# Patient Record
Sex: Male | Born: 1960 | Race: Black or African American | Hispanic: No | Smoking: Never smoker
Health system: Southern US, Community
[De-identification: ages and names within clinical notes are randomized; demographics above are authoritative.]

## PROBLEM LIST (undated history)

## (undated) DIAGNOSIS — B181 Chronic viral hepatitis B without delta-agent: Secondary | ICD-10-CM

## (undated) DIAGNOSIS — I10 Essential (primary) hypertension: Secondary | ICD-10-CM

## (undated) DIAGNOSIS — N189 Chronic kidney disease, unspecified: Secondary | ICD-10-CM

## (undated) DIAGNOSIS — L818 Other specified disorders of pigmentation: Secondary | ICD-10-CM

## (undated) HISTORY — DX: Essential (primary) hypertension: I10

## (undated) HISTORY — DX: Other specified disorders of pigmentation: L81.8

## (undated) HISTORY — DX: Chronic viral hepatitis B without delta-agent: B18.1

## (undated) HISTORY — DX: Chronic kidney disease, unspecified: N18.9

---

## 1999-08-19 ENCOUNTER — Emergency Department (HOSPITAL_COMMUNITY): Admission: EM | Admit: 1999-08-19 | Discharge: 1999-08-19 | Payer: Self-pay | Admitting: Emergency Medicine

## 2002-02-24 ENCOUNTER — Encounter: Payer: Self-pay | Admitting: Gastroenterology

## 2002-02-24 ENCOUNTER — Encounter (INDEPENDENT_AMBULATORY_CARE_PROVIDER_SITE_OTHER): Payer: Self-pay | Admitting: Specialist

## 2002-02-24 ENCOUNTER — Ambulatory Visit (HOSPITAL_COMMUNITY): Admission: RE | Admit: 2002-02-24 | Discharge: 2002-02-24 | Payer: Self-pay | Admitting: Gastroenterology

## 2003-01-16 ENCOUNTER — Encounter: Payer: Self-pay | Admitting: Family Medicine

## 2003-01-16 ENCOUNTER — Ambulatory Visit (HOSPITAL_COMMUNITY): Admission: RE | Admit: 2003-01-16 | Discharge: 2003-01-16 | Payer: Self-pay | Admitting: Family Medicine

## 2008-11-11 ENCOUNTER — Encounter: Payer: Self-pay | Admitting: Gastroenterology

## 2009-07-07 ENCOUNTER — Encounter: Payer: Self-pay | Admitting: Gastroenterology

## 2009-11-18 ENCOUNTER — Ambulatory Visit: Payer: Self-pay | Admitting: Gastroenterology

## 2009-12-15 ENCOUNTER — Ambulatory Visit (HOSPITAL_COMMUNITY): Admission: RE | Admit: 2009-12-15 | Discharge: 2009-12-15 | Payer: Self-pay | Admitting: Gastroenterology

## 2010-03-31 ENCOUNTER — Ambulatory Visit: Payer: Self-pay | Admitting: Gastroenterology

## 2010-04-06 ENCOUNTER — Encounter (INDEPENDENT_AMBULATORY_CARE_PROVIDER_SITE_OTHER): Payer: Self-pay | Admitting: *Deleted

## 2010-05-06 ENCOUNTER — Encounter (INDEPENDENT_AMBULATORY_CARE_PROVIDER_SITE_OTHER): Payer: Self-pay | Admitting: *Deleted

## 2010-05-11 ENCOUNTER — Ambulatory Visit: Payer: Self-pay | Admitting: Gastroenterology

## 2010-05-25 ENCOUNTER — Ambulatory Visit: Payer: Self-pay | Admitting: Gastroenterology

## 2010-05-25 HISTORY — PX: COLONOSCOPY: SHX174

## 2010-06-23 ENCOUNTER — Ambulatory Visit: Payer: Self-pay | Admitting: Gastroenterology

## 2010-09-04 ENCOUNTER — Encounter: Payer: Self-pay | Admitting: Gastroenterology

## 2010-09-13 NOTE — Letter (Signed)
Summary: Previsit letter  [facility]  [address] [place], [place] [address]   Phone: [phone]  Fax: [phone]       04/06/2010 MRN: [id number]  [facility] [address] [place], [place]  [address]  [place]  Dear Mr. BAIG,  Welcome to the Gastroenterology Division at [facility].    You are scheduled to see a nurse for your pre-procedure visit on 05-11-10 at 11:00a.m. on the 3rd floor at [facility], [address]  We ask that you try to arrive at our office 15 minutes prior to your appointment time to allow for check-in.  Your nurse visit will consist of discussing your medical and surgical history, your immediate family medical history, and your medications.    Please bring a complete list of all your medications or, if you prefer, bring the medication bottles and we will list them.  We will need to be aware of both prescribed and over the counter drugs.  We will need to know exact dosage information as well.  If you are on blood thinners (Coumadin, Plavix, Aggrenox, Ticlid, etc.) please call our office today/prior to your appointment, as we need to consult with your physician about holding your medication.   Please be prepared to read and sign documents such as consent forms, a financial agreement, and acknowledgement forms.  If necessary, and with your consent, a friend or relative is welcome to sit-in on the nurse visit with you.  Please bring your insurance card so that we may make a copy of it.  If your insurance requires a referral to see a specialist, please bring your referral form from your primary care physician.  No co-pay is required for this nurse visit.     If you cannot keep your appointment, please call [phone] to cancel or reschedule prior to your appointment date.  This allows [place] the opportunity to schedule an appointment for another patient in need of care.    Thank you for choosing [facility] for your medical  needs.  We appreciate the opportunity to care for you.  Please visit [place] at our website  to learn more about our practice.                     Sincerely.                                                                                                                   The Gastroenterology Division

## 2010-09-13 NOTE — Letter (Signed)
Summary: Moviprep Instructions  [facility]  [address]   [place], [place] [address]   Phone: [phone]  Fax: [phone]       [name] [name]    Dec 15, 1960    MRN: [id number]        Procedure Day Dorna Bloom: Wednesday, 05-25-10     Arrival Time: 1:30 p.m.     Procedure Time: 2:30 p.m.     Location of Procedure:                    x   [facility] (4th Floor)                        PREPARATION FOR COLONOSCOPY WITH MOVIPREP   Starting 5 days prior to your procedure 05-20-10  do not eat nuts, seeds, popcorn, corn, beans, peas,  salads, or any raw vegetables.  Do not take any fiber supplements (e.g. Metamucil, Citrucel, and Benefiber).  THE DAY BEFORE YOUR PROCEDURE         DATE: 05-24-10  DAY: Tuesday   1.  Drink clear liquids the entire day-NO SOLID FOOD  2.  Do not drink anything colored red or purple.  Avoid juices with pulp.  No orange juice.  3.  Drink at least 64 oz. (8 glasses) of fluid/clear liquids during the day to prevent dehydration and help the prep work efficiently.  CLEAR LIQUIDS INCLUDE: Water Jello Ice Popsicles Tea (sugar ok, no milk/cream) Powdered fruit flavored drinks Coffee (sugar ok, no milk/cream) Gatorade Juice: apple, white grape, white cranberry  Lemonade Clear bullion, consomm, broth Carbonated beverages (any kind) Strained chicken noodle soup Hard Candy                             4.  In the morning, mix first dose of MoviPrep solution:    Empty 1 Pouch A and 1 Pouch B into the disposable container    Add lukewarm drinking water to the top line of the container. Mix to dissolve    Refrigerate (mixed solution should be used within 24 hrs)  5.  Begin drinking the prep at 5:00 p.m. The MoviPrep container is divided by 4 marks.   Every 15 minutes drink the solution down to the next mark (approximately 8 oz) until the full liter is complete.   6.  Follow completed prep with 16 oz of clear liquid of your  choice (Nothing red or purple).  Continue to drink clear liquids until bedtime.  7.  Before going to bed, mix second dose of MoviPrep solution:    Empty 1 Pouch A and 1 Pouch B into the disposable container    Add lukewarm drinking water to the top line of the container. Mix to dissolve    Refrigerate  THE DAY OF YOUR PROCEDURE      DATE: 05-25-10  DAY: Wednesday  Beginning at 9:30 a.m.. (5 hours before procedure):         1. Every 15 minutes, drink the solution down to the next mark (approx 8 oz) until the full liter is complete.  2. Follow completed prep with 16 oz. of clear liquid of your choice.    3. You may drink clear liquids until 12:30 p.m. (2 HOURS BEFORE PROCEDURE).   MEDICATION INSTRUCTIONS  Unless otherwise instructed, you should take regular prescription medications with a small sip of water   as early as possible  the morning of your procedure.        OTHER INSTRUCTIONS  You will need a responsible adult at least 51 years of age to accompany you and drive you home.   This person must remain in the waiting room during your procedure.  Wear loose fitting clothing that is easily removed.  Leave jewelry and other valuables at home.  However, you may wish to bring a book to read or  an iPod/MP3 player to listen to music as you wait for your procedure to start.  Remove all body piercing jewelry and leave at home.  Total time from sign-in until discharge is approximately 2-3 hours.  You should go home directly after your procedure and rest.  You can resume normal activities the  day after your procedure.  The day of your procedure you should not:   Drive   Make legal decisions   Operate machinery   Drink alcohol   Return to work  You will receive specific instructions about eating, activities and medications before you leave.    The above instructions have been reviewed and explained to me by   Karl Bales RN  May 11, 2010 11:23  AM    I fully understand and can verbalize these instructions _____________________________ Date _________

## 2010-09-13 NOTE — Miscellaneous (Signed)
Summary: [facility] previsit  Clinical Lists Changes  Medications: Added new medication of MOVIPREP 100 GM  SOLR (PEG-KCL-NACL-NASULF-NA ASC-C) As per prep instructions. - Signed Rx of MOVIPREP 100 GM  SOLR (PEG-KCL-NACL-NASULF-NA ASC-C) As per prep instructions.;  #1 x 0;  Signed;  Entered by: Karl Bales RN;  Authorized by: Louis Meckel MD;  Method used: [facility] to [facility] [phone]*, [address], [place], [place], [place]  [address], Ph: [phone] or [phone], Fax: [phone] Observations: Added new observation of NKA: T (05/11/2010 10:46)    Prescriptions: MOVIPREP 100 GM  SOLR (PEG-KCL-NACL-NASULF-NA ASC-C) As per prep instructions.  #1 x 0   Entered by:   Karl Bales RN   Authorized by:   Louis Meckel MD   Signed by:   Karl Bales RN on 05/11/2010   Method used:   Electronically to        [facility] [phone]* (retail)       [address]       [place], [place]  [address]       Ph: [phone] or [phone]       Fax: [phone]   RxID:   [id number]

## 2010-09-13 NOTE — Letter (Signed)
Summary: [facility]   Imported By: Sherian Rein 10/29/2009 15:16:16  _____________________________________________________________________  External Attachment:    Type:   Image     Comment:   External Document

## 2010-09-13 NOTE — Procedures (Signed)
Summary: Colonoscopy  Patient: [name] [name] Note: All result statuses are Final unless otherwise noted.  Tests: (1) Colonoscopy (COL)   COL Colonoscopy           DONE     [facility]     [address]     [place], [place]  [address]           COLONOSCOPY PROCEDURE REPORT           PATIENT:  [name], [name]  MR#:  [id number]     BIRTHDATE:  01-25-1961, 49 yrs. old  GENDER:  male           ENDOSCOPIST:  Barbette Hair. Arlyce Dice, MD     Referred by:           PROCEDURE DATE:  05/25/2010     PROCEDURE:  Diagnostic Colonoscopy     ASA CLASS:  Class II     INDICATIONS:  1) Routine Risk Screening           MEDICATIONS:   Fentanyl 75 mcg IV, Versed 7 mg IV           DESCRIPTION OF PROCEDURE:   After the risks benefits and     alternatives of the procedure were thoroughly explained, informed     consent was obtained.  Digital rectal exam was performed and     revealed no abnormalities.   The [id number] [id number] endoscope was     introduced through the anus and advanced to the cecum, which was     identified by both the appendix and ileocecal valve, without     limitations.  The quality of the prep was excellent, using     MiraLax.  The instrument was then slowly withdrawn as the colon     was fully examined.     <<PROCEDUREIMAGES>>           FINDINGS:  A normal appearing cecum, ileocecal valve, and     appendiceal orifice were identified. The ascending, hepatic     flexure, transverse, splenic flexure, descending, sigmoid colon,     and rectum appeared unremarkable (see image1, image3, image6,     image7, image8, image13, image14, and image16).   Retroflexed     views in the rectum revealed no abnormalities.    The time to     cecum =  2.75  minutes. The scope was then withdrawn (time =  9.0     min) from the patient and the procedure completed.           COMPLICATIONS:  None           ENDOSCOPIC IMPRESSION:     1) Normal colon     RECOMMENDATIONS:     1) Continue current  colorectal screening recommendations for     "routine risk" patients with a repeat colonoscopy in 10 years.           REPEAT EXAM:  In 10 year(s) for Colonoscopy.           ______________________________     Barbette Hair. Arlyce Dice, MD           CC: Rudi Heap, MD           n.     Rosalie Doctor:   Barbette Hair. Kaplan at 05/25/2010 03:12 PM           [name], [id number]  Note: An exclamation mark (!) indicates a result that was not dispersed into the flowsheet. Document Creation  Date: 05/25/2010 3:13 PM _______________________________________________________________________  (1) Order result status: Final Collection or observation date-time: 05/25/2010 15:01 Requested date-time:  Receipt date-time:  Reported date-time:  Referring Physician:   Ordering Physician: Melvia Heaps [phone]) Specimen Source:  Source: Launa Grill Order Number: [id number] Lab site:   Appended Document: Colonoscopy    Clinical Lists Changes  Observations: Added new observation of COLONNXTDUE: 05/2020 (05/25/2010 15:41)

## 2010-09-13 NOTE — Letter (Signed)
Summary: [facility]   Imported By: Sherian Rein 10/29/2009 15:17:56  _____________________________________________________________________  External Attachment:    Type:   Image     Comment:   External Document

## 2010-09-29 ENCOUNTER — Ambulatory Visit: Payer: Self-pay | Admitting: Gastroenterology

## 2010-12-29 ENCOUNTER — Ambulatory Visit (INDEPENDENT_AMBULATORY_CARE_PROVIDER_SITE_OTHER): Payer: Self-pay | Admitting: Gastroenterology

## 2010-12-29 VITALS — BP 147/92 | HR 59 | Temp 96.7°F | Ht 70.0 in | Wt 196.0 lb

## 2010-12-29 DIAGNOSIS — B181 Chronic viral hepatitis B without delta-agent: Secondary | ICD-10-CM

## 2010-12-29 LAB — PHOSPHORUS: Phosphorus: 3 mg/dL (ref 2.3–4.6)

## 2010-12-29 LAB — HEPATIC FUNCTION PANEL
Albumin: 4.5 g/dL (ref 3.5–5.2)
Alkaline Phosphatase: 65 U/L (ref 39–117)
Total Protein: 7.1 g/dL (ref 6.0–8.3)

## 2010-12-29 LAB — AFP TUMOR MARKER: AFP-Tumor Marker: 6.7 ng/mL (ref 0.0–8.0)

## 2010-12-29 MED ORDER — TENOFOVIR DISOPROXIL FUMARATE 300 MG PO TABS: 300.0000 mg | ORAL_TABLET | Freq: Every day | ORAL | Status: DC

## 2010-12-29 NOTE — Progress Notes (Signed)
cc: Referring Physician: Rudi Heap, MD, [facility], [address], [place], [place] [address], Fax # [phone]    [facility] MEDICAL RECORD [id number]   REASON FOR VISIT:  Follow up genotype G, hepatitis B E antigen positive HBV.    history:  The patient returns today unaccompanied. Since last being seen on 06/23/2010, he feels well. There are no symptoms to suggest decompensated liver disease nor are there symptoms directly referable to his history of HBV.   He ran out of tenofovir two weeks ago due to a change in insurance at his work.  Past medical history:  No interval change.   CURRENT MEDICATIONS:  Tenofovir 300 mg p.o. Daily (supposed to be on this but off this for two weeks), entecavir 0.5 mg p.o. daily, calcium with vitamin D 500 mg/200 international units p.o. b.i.d.   Allergies:  Denies.   habits:  Smoking never. Alcohol as never.   REVIEW OF SYSTEMS:  All 10 systems reviewed today with the patient and they are negative other than which was mentioned above. His CES-D was a 7.   PHYSICAL EXAMINATION:  Constitutional: Appeared well.  Vital signs: BP 147/92  Pulse 59  Temp 96.7 F (35.9 C)  Ht 5\' 10"  (1.778 m)  Wt 196 lb (88.905 kg)  BMI 28.12 kg/m2 Ears, nose, mouth and throat:  Unremarkable oropharynx.  No thyromegaly or neck masses.   Chest:  Resonant to percussion.  Clear to auscultation.   Cardiovascular:  Heart sounds normal S1, S2 without murmurs or rubs.  There is no peripheral edema.   Abdominal:  Normal  bowel sounds.  No masses or tenderness.  I could not appreciate a liver edge or spleen tip.  I could not appreciate any hernias.   Lymphatics:  No cervical or inguinal lymphadenopathy.   Central Nervous System:  No asterixis or focal neurologic findings.   Dermatologic:  Anicteric without palmar erythema or spider angiomata.   Eyes:  Anicteric sclerae.  Pupils are equal and reactive to light.   LABORATORY STUDIES:  From  06/23/2010 Creatinine 1.61.  HBV DNA 617 IU/mL.  Hepatitis B e antigen positive.  ALT  64.  His last ultrasound was on 12/15/2009.      Assessment:  The patient is a 50 year old gentleman with a history of genotype G, E antigen positive HBV with a previous biopsy November 2008 showing grade 1 stage 1 disease. He is on a combination of tenofovir since  2006, which was really started as emtricitabine/tenofovir but now on tenofovir monotherapy, and entecavir added September 2007 for lack of response.  So he is on a combination of tenofovir and entecavir for 4.5 years.  He remains well. Viral load remains suppressed and is not on the rise to suggest there was no resistance. To be sure it would be better if it was negative considering the degree of medication he is taking.  In my discussion today with the patient, we reviewed the results of testing.  I explained that he must not run out his meds.  PLAN:  1. Continue with current medications for the foreseeable future unless labs today dictate otherwise. 2. Will check his creatinine, phosphorus and his HBV DNA, E antigen and E antibody. 3. He is to return in 6 months' time. 4. He was hepatitis A immune previously. 5. Ultrasound of liver to be booked. 6. I have given a prescription for tenofovir 300 mg p.o. daily, 90-day supply with 4 refills.

## 2011-01-01 LAB — HEPATITIS B E ANTIBODY: Hepatitis Be Antibody: NONREACTIVE

## 2011-01-01 LAB — HEPATITIS B E ANTIGEN: Hepatitis Be Antigen: REACTIVE — AB

## 2011-01-04 LAB — HEPATITIS B DNA, ULTRAQUANTITATIVE, PCR
Hepatitis B DNA (Calc): 19031 copies/mL — ABNORMAL HIGH (ref <116)
Hepatitis B DNA: 3270 IU/mL — ABNORMAL HIGH (ref <20)

## 2011-01-06 ENCOUNTER — Other Ambulatory Visit (HOSPITAL_COMMUNITY): Payer: Self-pay

## 2011-07-13 ENCOUNTER — Ambulatory Visit: Payer: Self-pay | Admitting: Gastroenterology

## 2011-07-27 ENCOUNTER — Ambulatory Visit (INDEPENDENT_AMBULATORY_CARE_PROVIDER_SITE_OTHER): Payer: Self-pay | Admitting: Gastroenterology

## 2011-07-27 DIAGNOSIS — B181 Chronic viral hepatitis B without delta-agent: Secondary | ICD-10-CM

## 2011-07-27 LAB — PHOSPHORUS: Phosphorus: 2.9 mg/dL (ref 2.3–4.6)

## 2011-07-28 LAB — HEPATITIS B SURFACE ANTIBODY,QUALITATIVE: Hep B S Ab: NEGATIVE

## 2011-07-31 LAB — HEPATITIS B E ANTIBODY: Hepatitis Be Antibody: NEGATIVE

## 2011-07-31 LAB — HEPATITIS B E ANTIGEN: Hepatitis Be Antigen: POSITIVE — AB

## 2011-08-03 ENCOUNTER — Encounter: Payer: Self-pay | Admitting: Gastroenterology

## 2011-08-03 NOTE — Progress Notes (Addendum)
NAMEMarland Kitchen  ABHIRAM, CRIADO  MR#:  [id number]      DATE:  07/27/2011  DOB:  [birth year]    cc: Referring Physician: Rudi Heap, MD, [facility], [address], [place], [place] [address], Fax # [phone]     [facility] medical record number:  [id number]   REASON FOR VISIT:  Followup genotype G, E antigen positive hepatitis B.    HISTORY:  The patient returns unaccompanied. Since last being seen he has no complaints referable to his history of hepatitis B. There are no symptoms to suggest decompensated liver disease or vasculitis.   PAST MEDICAL HISTORY:  No interval change.   CURRENT MEDICATIONS:  1. Tenofovir 300 mg daily. 2. Entecavir 0.5 mg daily. 3. Calcium with vitamin D 500 mg/200 international units b.i.d.   Allergies:  Denies.     habits:   Smoking:  Never.    Alcohol:  Denies interval consumption.   REVIEW OF SYSTEMS:  All 10 systems reviewed today with the patient, and they are negative other than that which was mentioned above. CES-D was 3.   PHYSICAL EXAMINATION:  Constitutional: Well appearing. Vital signs: Height 69 inches, weight 205 pounds, blood pressure 155/80, 82, pulse 62, temperature 97.5 degrees Fahrenheit. Ears, nose, mouth and throat:  Unremarkable  oropharynx.  No thyromegaly or neck masses.  Chest:  Resonant to percussion.  Clear to auscultation.  Cardiovascular:  Heart sounds normal S1, S2 without murmurs or rubs.  There is no peripheral edema.   Abdominal:  Normal bowel sounds.  No masses or tenderness.  I could not appreciate a liver edge or spleen tip.  I could not appreciate any hernias.  Lymphatics:  No cervical or inguinal lymphadenopathy.   Central Nervous System:  No asterixis or focal neurologic findings.  Dermatologic:  Anicteric without palmar erythema or spider angiomata.  Eyes:  Anicteric sclerae.  Pupils are equal and reactive to light.   LABORATORY STUDIES:  From 12/29/2010 his ALT was 52. His hepatitis B virus  DNA was 3270, which has gone up from previous. His hepatitis B surface antigen has remained positive. His creatinine was 1.39.   ASSESSMENT:  The patient is a 50 year old gentleman with a history of genotype G, E antigen positive hepatitis B virus with a previous biopsy in November 2008 showing grade 1, stage I disease. He has been on some combination containing tenofovir since 2006 which was really started as emtricitabine and tenofovir but now it is not for model therapy. Entecavir replaced emtricitabine in 2007 for lack of response. He has been on a combination of entecavir and tenofovir for approximately 5 years. He remains well compensated.  Viral load remains suppressed, although his viral load had gone up from 617 international units per mL to 3270 international units per mL by 12/29/2010. Will need to see what the trend is today to see if he is developing resistance. Otherwise, in terms of hepatitis B care he will be due for another ultrasound.  He is previously hepatitis A immune.   In my discussion today with the patient, we discussed his course to date. We discussed obtaining lab work and the ultrasound.   Plan:  1. Current medications to continue. 2. Hepatitis A immune previously. 3. Check standard labs including creatinine, phosphorus, hepatitis B virus DNA, E antigen, E antibody. 4. Book for ultrasound in May 2013. 5. Return in 6 months' time in followup.            Brooke Dare, MD  ADDENDUM: HBV DNA 211  IU/mL.   e Ag positive.  Creatinine 1.39.   ALT not done.  403 .[id number]  D:  Thu Dec 13 20:49:55 2012 ; T:  Sat Dec 15 13:15:51 2012  Job #:  [id number]

## 2011-10-12 ENCOUNTER — Other Ambulatory Visit: Payer: Self-pay | Admitting: Gastroenterology

## 2011-10-12 DIAGNOSIS — B181 Chronic viral hepatitis B without delta-agent: Secondary | ICD-10-CM

## 2011-10-12 MED ORDER — TENOFOVIR DISOPROXIL FUMARATE 300 MG PO TABS
300.0000 mg | ORAL_TABLET | Freq: Every day | ORAL | Status: DC
Start: 2011-10-12 — End: 2017-05-24

## 2011-10-12 NOTE — Progress Notes (Signed)
Renewed one month of tenofovir (with 4 refills in case they are needed), after patient called today indicating that he will be out of tenofovir tomorrow and cannot afford the 90 day supply copayment from [facility].  Leonides Sake called [employer] and they gave her a number the patient can call for assistance with his medication copayment, which she relayed to the patient.  I will complete any paperwork when faxed to me.

## 2012-01-25 ENCOUNTER — Ambulatory Visit (INDEPENDENT_AMBULATORY_CARE_PROVIDER_SITE_OTHER): Payer: Managed Care, Other (non HMO) | Admitting: Gastroenterology

## 2012-01-25 DIAGNOSIS — B181 Chronic viral hepatitis B without delta-agent: Secondary | ICD-10-CM

## 2012-01-25 DIAGNOSIS — N179 Acute kidney failure, unspecified: Secondary | ICD-10-CM

## 2012-01-26 LAB — HEPATIC FUNCTION PANEL
ALT: 19 U/L (ref 0–53)
AST: 47 U/L — ABNORMAL HIGH (ref 0–37)
Alkaline Phosphatase: 193 U/L — ABNORMAL HIGH (ref 39–117)
Bilirubin, Direct: 0.4 mg/dL — ABNORMAL HIGH (ref 0.0–0.3)

## 2012-01-26 LAB — AFP TUMOR MARKER: AFP-Tumor Marker: 5.4 ng/mL (ref 0.0–8.0)

## 2012-01-26 LAB — HEPATITIS B SURFACE ANTIBODY,QUALITATIVE: Hep B S Ab: NEGATIVE

## 2012-01-26 LAB — HEPATITIS B SURFACE ANTIGEN: Hepatitis B Surface Ag: POSITIVE — AB

## 2012-01-30 LAB — HEPATITIS B E ANTIBODY: Hepatitis Be Antibody: NEGATIVE

## 2012-01-30 LAB — HEPATITIS B E ANTIGEN: Hepatitis Be Antigen: POSITIVE — AB

## 2012-01-31 LAB — HEPATITIS B DNA, ULTRAQUANTITATIVE, PCR
Hepatitis B DNA (Calc): 66616 copies/mL — ABNORMAL HIGH (ref <116)
Hepatitis B DNA: 11446 IU/mL — ABNORMAL HIGH (ref <20)

## 2012-02-01 ENCOUNTER — Encounter: Payer: Self-pay | Admitting: Gastroenterology

## 2012-02-01 NOTE — Progress Notes (Signed)
NAME:  [name] [name], [name] [name]  MR#:  [id number]      DATE:  01/25/2012  DOB:  [birth year]    cc: Referring Physician:  Rudi Heap, MD, [facility], [address] [place], [place] [address], Fax [phone]    [facility] Medical Record #[id number]  REASON FOR VISIT:  Follow up of genotype G, E antigen positive hepatitis B.   HISTORY:  The patient returns today unaccompanied. Since last being seen there have been no complaints referable to his history of hepatitis B. There are no symptoms to suggest decompensated liver disease or vasculitis. Because of cost, he has self-discontinued the entecavir for the last month. He also asked if we can decrease the number of ultrasounds or at least delay the ultrasound until he meets his deductible.   He has been enrolled in the [employer] to assist in the cost of tenofovir.   PAST MEDICAL HISTORY:  No interval change.   CURRENT MEDICATIONS:  1. Tenofovir 300 mg daily.  2. Calcium with vitamin D 500 mg/200 international units b.i.d.   ALLERGIES:  Denies.   HABITS:  Smoking never. Alcohol denies interval consumption.   REVIEW OF SYSTEMS:  All 10 systems reviewed today with the patient and they are negative other than which is mentioned above. CES-D was not completed.   PHYSICAL examination:  Constitutional: Well appearing. Vital Signs: Height 69 inches, weight 200 pounds, blood pressure 153/67, pulse 66, temperature 97.3 Fahrenheit.   LABORATORIES:  Previous laboratory work from 07/26/2001, his E antigen was positive, and his E antibody was negative. HBV DNA was 211 international units per mL. ALT was never done.   Most recent imaging of the liver was 12/15/2009. I had ordered an ultrasound when I last saw him on 07/27/2011, to be done around his appointment today, but he never heard back from our office or [facility] as to the booking. Reportedly repeated calls went unanswered so this was not scheduled.    ASSESSMENT:  The patient is a 51 year old gentleman with history of genotype G, E antigen positive hepatitis B with a previous biopsy in November 2008 showing grade 1 stage I disease. He has been on some combination containing tenofovir since 2006, which was started as emtricitabine  and tenofovir, but no longer contains emtricitabine. Entecavir replaced the emtricitabine in 2007 due to lack of response. He has been on a combination of entecavir and tenofovir since September 2007 for approximately 5 years and 9-1/2 months and 301 weeks. His viral load remains suppressed, but his E antigen remains positive, so he will need to continue on treatment. I note, however, due to cost, he has discontinued the entecavir for last month. This will provide [place] an opportunity to see if he is now able to get by on tenofovir monotherapy. Perhaps this would the case because his viral loads have remained low.   In my discussion today with the patient, we discussed the cost of his treatment. I told him that I would be willing to concede that if his viral load is still low and his ALT remains normal, he can stay on tenofovir monotherapy. We discussed obtaining an ultrasound, due to cost, he would like to have this done at most once a year. He would like to wait until his deductible is met this year before it is scheduled. We have given him the phone number for [facility] radiology to contact them when he is ready to schedule the ultrasound.   PLAN:  1. Continue with tenofovir monotherapy.  2. Standard labs including HBV serologies.  3. He will contact [facility] to arrange the ultrasound, which was already ordered.   4. Hepatitis A immune.  5. Return in December 2013.               Brooke Dare, MD    ADDENDUM: Viral load has risen to 11446 IU/mL from previous of 211 IU/mL off of entecavir.  Will need to look into patient assistance for entecavir so he can afford it.  His creatinine is also up to 2.95  from 1.39.    Nephrotoxicity to tenofovir is rare and cannot stop the tenofovir right now because of risk of HBV flare.   I have called Mr Humber and spoke with him about the HBV DNA and the serum creatinine.  I will look into patient assistance programs from [employer].  Also ordered labs to start the evaluation of his renal function that he will go to [facility] in [place] to get done.  I told him he may need to see nephrology.   I also gave him the patient assistance number for [employer] for entecavir (left it on his cell voicemail).  [phone] .[id number]  D:  Thu Jun 13 18:07:59 2013 ; T:  Thu Jun 13 22:21:48 2013  Job #:  [id number]

## 2012-02-09 LAB — COMPREHENSIVE METABOLIC PANEL
AST: 42 U/L — ABNORMAL HIGH (ref 0–37)
Albumin: 4.2 g/dL (ref 3.5–5.2)
BUN: 13 mg/dL (ref 6–23)
Calcium: 9.1 mg/dL (ref 8.4–10.5)
Chloride: 108 mEq/L (ref 96–112)
Glucose, Bld: 112 mg/dL — ABNORMAL HIGH (ref 70–99)
Potassium: 3.8 mEq/L (ref 3.5–5.3)
Sodium: 142 mEq/L (ref 135–145)
Total Protein: 6.5 g/dL (ref 6.0–8.3)

## 2012-02-09 LAB — URINALYSIS
Bilirubin Urine: NEGATIVE
Ketones, ur: NEGATIVE mg/dL
Specific Gravity, Urine: 1.023 (ref 1.005–1.030)
pH: 6.5 (ref 5.0–8.0)

## 2012-02-09 LAB — RPR

## 2012-02-12 ENCOUNTER — Other Ambulatory Visit: Payer: Self-pay | Admitting: Gastroenterology

## 2012-02-12 LAB — ANTI-DNA ANTIBODY, DOUBLE-STRANDED: ds DNA Ab: 9 IU/mL (ref <30)

## 2012-02-13 LAB — PROTEIN ELECTROPHORESIS, URINE REFLEX

## 2012-02-16 LAB — PROTEIN ELECTROPHORESIS, SERUM
Albumin ELP: 60 % (ref 55.8–66.1)
Beta 2: 3.6 % (ref 3.2–6.5)
Beta Globulin: 6.3 % (ref 4.7–7.2)
Gamma Globulin: 17.1 % (ref 11.1–18.8)

## 2012-02-16 LAB — PROTEIN ELECTROPHORESIS, URINE REFLEX: Total Protein, Urine/Day: 72 mg/d (ref 50–100)

## 2012-02-26 ENCOUNTER — Other Ambulatory Visit (HOSPITAL_COMMUNITY): Payer: Managed Care, Other (non HMO)

## 2012-03-25 ENCOUNTER — Ambulatory Visit (HOSPITAL_COMMUNITY)
Admission: RE | Admit: 2012-03-25 | Discharge: 2012-03-25 | Disposition: A | Discharge status: Home / Self Care | Payer: Managed Care, Other (non HMO) | Source: Ambulatory Visit | Attending: Gastroenterology | Admitting: Gastroenterology

## 2012-03-25 DIAGNOSIS — B181 Chronic viral hepatitis B without delta-agent: Secondary | ICD-10-CM | POA: Insufficient documentation

## 2012-03-28 ENCOUNTER — Encounter: Payer: Self-pay | Admitting: Gastroenterology

## 2012-10-13 ENCOUNTER — Other Ambulatory Visit: Payer: Self-pay | Admitting: Gastroenterology

## 2012-10-31 ENCOUNTER — Other Ambulatory Visit: Payer: Self-pay | Admitting: Physician Assistant

## 2012-10-31 DIAGNOSIS — B1910 Unspecified viral hepatitis B without hepatic coma: Secondary | ICD-10-CM

## 2013-01-15 ENCOUNTER — Telehealth: Payer: Self-pay | Admitting: Family Medicine

## 2013-02-03 ENCOUNTER — Ambulatory Visit (INDEPENDENT_AMBULATORY_CARE_PROVIDER_SITE_OTHER): Payer: Commercial Indemnity | Admitting: Physician Assistant

## 2013-02-03 VITALS — BP 172/82 | HR 82 | Temp 97.1°F | Ht 70.0 in | Wt 197.0 lb

## 2013-02-03 DIAGNOSIS — M25512 Pain in left shoulder: Secondary | ICD-10-CM

## 2013-02-03 DIAGNOSIS — M25519 Pain in unspecified shoulder: Secondary | ICD-10-CM

## 2013-02-03 MED ORDER — MELOXICAM 15 MG PO TABS: 15.0000 mg | ORAL_TABLET | Freq: Every day | ORAL | Status: DC

## 2013-02-03 NOTE — Progress Notes (Signed)
Subjective:     Patient ID: [name] [name], male   DOB: 1961/01/14, 52 y.o.   MRN: [id number]  HPI Pt with mult joints pain for months Seen as a WI today due to L shoulder pain for weeks H has intermit sx of numbness that radiate to the L arm He is very active in weightlifting Denies definite injury No OTC meds tried   Review of Systems  All other systems reviewed and are negative.       Objective:   Physical Exam  Nursing note and vitals reviewed. NAD sitting comfortably + TTP along the superior lat trap area FROM of C-spine- sx with flexion and rotation R Shoulder shrug equal but reproduces sx - TTP L shoulder FROM L shoulder No sx with rest abduction, or pronation/supination Good strength to bicep, tricep, grip Pulses/sensory good     Assessment:     Trap strain    Plan:     Heat/Ice Decrease shoulder work in gym Mobic rx F/U prn If sx cont C-spine films

## 2013-02-03 NOTE — Patient Instructions (Signed)
Shoulder Pain  The shoulder is the joint that connects your arms to your body. The bones that form the shoulder joint include the upper arm bone (humerus), the shoulder blade (scapula), and the collarbone (clavicle). The top of the humerus is shaped like a ball and fits into a rather flat socket on the scapula (glenoid cavity). A combination of muscles and strong, fibrous tissues that connect muscles to bones (tendons) support your shoulder joint and hold the ball in the socket. Small, fluid-filled sacs (bursae) are located in different areas of the joint. They act as cushions between the bones and the overlying soft tissues and help reduce friction between the gliding tendons and the bone as you move your arm. Your shoulder joint allows a wide range of motion in your arm. This range of motion allows you to do things like scratch your back or throw a ball. However, this range of motion also makes your shoulder more prone to pain from overuse and injury.  Causes of shoulder pain can originate from both injury and overuse and usually can be grouped in the following four categories:   Redness, swelling, and pain (inflammation) of the tendon (tendinitis) or the bursae (bursitis).   Instability, such as a dislocation of the joint.   Inflammation of the joint (arthritis).   Broken bone (fracture).  HOME CARE INSTRUCTIONS    Apply ice to the sore area.   Put ice in a plastic bag.   Place a towel between your skin and the bag.   Leave the ice on for 15-20 minutes, 3-4 times per day for the first 2 days.   Stop using cold packs if they do not help with the pain.   If you have a shoulder sling or immobilizer, wear it as long as your caregiver instructs. Only remove it to shower or bathe. Move your arm as little as possible, but keep your hand moving to prevent swelling.   Squeeze a soft ball or foam pad as much as possible to help prevent swelling.   Only take over-the-counter or prescription medicines for pain,  discomfort, or fever as directed by your caregiver.  SEEK MEDICAL CARE IF:    Your shoulder pain increases, or new pain develops in your arm, hand, or fingers.   Your hand or fingers become cold and numb.   Your pain is not relieved with medicines.  SEEK IMMEDIATE MEDICAL CARE IF:    Your arm, hand, or fingers are numb or tingling.   Your arm, hand, or fingers are significantly swollen or turn white or blue.  MAKE SURE YOU:    Understand these instructions.   Will watch your condition.   Will get help right away if you are not doing well or get worse.  Document Released: 05/10/2005 Document Revised: 04/24/2012 Document Reviewed: 07/15/2011  [employer] Patient Information 2014 [employer].

## 2013-02-10 ENCOUNTER — Emergency Department (HOSPITAL_COMMUNITY)
Admission: EM | Admit: 2013-02-10 | Discharge: 2013-02-10 | Disposition: A | Discharge status: Home / Self Care | Payer: Managed Care, Other (non HMO) | Attending: Emergency Medicine | Admitting: Emergency Medicine

## 2013-02-10 ENCOUNTER — Encounter (HOSPITAL_COMMUNITY): Payer: Self-pay | Admitting: *Deleted

## 2013-02-10 DIAGNOSIS — M25519 Pain in unspecified shoulder: Secondary | ICD-10-CM | POA: Insufficient documentation

## 2013-02-10 DIAGNOSIS — R071 Chest pain on breathing: Secondary | ICD-10-CM | POA: Insufficient documentation

## 2013-02-10 DIAGNOSIS — Z8619 Personal history of other infectious and parasitic diseases: Secondary | ICD-10-CM | POA: Insufficient documentation

## 2013-02-10 DIAGNOSIS — R0789 Other chest pain: Secondary | ICD-10-CM

## 2013-02-10 DIAGNOSIS — G8911 Acute pain due to trauma: Secondary | ICD-10-CM | POA: Insufficient documentation

## 2013-02-10 DIAGNOSIS — M542 Cervicalgia: Secondary | ICD-10-CM | POA: Insufficient documentation

## 2013-02-10 DIAGNOSIS — Z79899 Other long term (current) drug therapy: Secondary | ICD-10-CM | POA: Insufficient documentation

## 2013-02-10 DIAGNOSIS — S46812D Strain of other muscles, fascia and tendons at shoulder and upper arm level, left arm, subsequent encounter: Secondary | ICD-10-CM

## 2013-02-10 DIAGNOSIS — R209 Unspecified disturbances of skin sensation: Secondary | ICD-10-CM | POA: Insufficient documentation

## 2013-02-10 MED ORDER — METHOCARBAMOL 500 MG PO TABS: 1000.0000 mg | ORAL_TABLET | Freq: Four times a day (QID) | ORAL | Status: DC

## 2013-02-10 MED ORDER — MELOXICAM 15 MG PO TABS: 15.0000 mg | ORAL_TABLET | Freq: Every day | ORAL | Status: DC

## 2013-02-10 NOTE — ED Notes (Signed)
Pt states CP and aching to left arm x 2 weeks. Pain is described as intermittent, "like a knot, or tightness".

## 2013-02-10 NOTE — ED Provider Notes (Signed)
History    This chart was scribed for Ward Givens, MD by Donne Anon, ED Scribe. This patient was seen in room APA08/APA08 and the patient's care was started at 1021.  CSN: [id number] Arrival date & time 02/10/13  8295  First MD Initiated Contact with Patient 02/10/13 1021     Chief Complaint  Patient presents with  . Chest Pain    The history is provided by the patient. No language interpreter was used.   HPI Comments: [name] [name] is a 52 y.o. male who presents to the Emergency Department complaining of 3 weeks of gradual onset neck pain and left arm numbness with intermittent left sided CP described as tightness and a knot beginning 3 days ago. He went to his PCP 1 week ago and was told he has an inflamed  elbows and possible strained trapezius and prescribed him Mobic, which provided moderate relief of his pain and swelling. He reports he lifts 350 lbs on a regular basis, and he last lifted 4 days ago. Both of his elbows hurt with movement. He reports the CP began as a muscle spasm in his left shoulder that moved into his left chest. He denies SOB, diaphoresis, nausea, vomiting or any other pain. He deneis any similar past CP episodes. He has tried heat with little relief. Pt states he lefts weights and was lifting 350 pounds (pt weighs 190 pounds). He was told by Dr Christell Constant to cut back on the amount he is weight lifting.   Dr. Christell Constant is his PCP.   He reports DM his only past family medical history. He denies alcohol or tobacco use.    Past Medical History  Diagnosis Date  . Hep B w/o coma, chronic, w/o delta    History reviewed. No pertinent past surgical history. Family History  Problem Relation Age of Onset  . Diabetes Mother   . Hypertension Mother   . Diabetes Father    History  Substance Use Topics  . Smoking status: Never Smoker   . Smokeless tobacco: Not on file  . Alcohol Use: No  employed  Review of Systems  Constitutional: Negative for diaphoresis.  HENT:  Positive for neck pain.   Respiratory: Negative for shortness of breath.   Cardiovascular: Positive for chest pain.  Gastrointestinal: Negative for nausea and vomiting.  Musculoskeletal: Positive for myalgias and arthralgias.  All other systems reviewed and are negative.    Allergies  Review of patient's allergies indicates no known allergies.  Home Medications   Current Outpatient Rx  Name  Route  Sig  Dispense  Refill  . entecavir (BARACLUDE) 0.5 MG tablet   Oral   Take 0.5 mg by mouth daily.           . meloxicam (MOBIC) 15 MG tablet   Oral   Take 1 tablet (15 mg total) by mouth daily.   15 tablet   0   . tenofovir (VIREAD) 300 MG tablet   Oral   Take 1 tablet (300 mg total) by mouth daily.   30 tablet   4     Dispense as written.    BP 165/70  Pulse 68  Temp(Src) 98.5 F (36.9 C) (Oral)  Resp 18  Ht 5\' 9"  (1.753 m)  Wt 196 lb (88.905 kg)  BMI 28.93 kg/m2  SpO2 98%   Vital signs normal    Physical Exam  Nursing note and vitals reviewed. Constitutional: He is oriented to person, place, and time. He  appears well-developed and well-nourished.  Non-toxic appearance. He does not appear ill. No distress.  HENT:  Head: Normocephalic and atraumatic.  Right Ear: External ear normal.  Left Ear: External ear normal.  Nose: Nose normal. No mucosal edema or rhinorrhea.  Mouth/Throat: Oropharynx is clear and moist and mucous membranes are normal. No dental abscesses or edematous.  Eyes: Conjunctivae and EOM are normal. Pupils are equal, round, and reactive to light.  Neck: Normal range of motion and full passive range of motion without pain. Neck supple.  Cardiovascular: Normal rate, regular rhythm and normal heart sounds.  Exam reveals no gallop and no friction rub.   No murmur heard. Pulmonary/Chest: Effort normal and breath sounds normal. No respiratory distress. He has no wheezes. He has no rhonchi. He has no rales. He exhibits no tenderness and no crepitus.     Abdominal: Soft. Normal appearance and bowel sounds are normal. He exhibits no distension. There is no tenderness. There is no rebound and no guarding.  Musculoskeletal: Normal range of motion. He exhibits no edema and no tenderness.  Moves all extremities well.   Neurological: He is alert and oriented to person, place, and time. He has normal strength. No cranial nerve deficit.  Skin: Skin is warm, dry and intact. No rash noted. No erythema. No pallor.  Psychiatric: He has a normal mood and affect. His speech is normal and behavior is normal. His mood appears not anxious.    ED Course  Procedures (including critical care time)   DIAGNOSTIC STUDIES: Oxygen Saturation is 98% on RA, normal by my interpretation.    COORDINATION OF CARE: 10:47 AM Discussed treatment plan which includes muscle relaxants, more Mobic and ice with pt at bedside and pt agreed to plan. Informed of normal EKG. Advised pt to reduce weight lifting to allow muscles to rest. Return precautions advised.  Pt states he only has about 5 mobic pills left   Date: 02/10/2013  Rate: 65  Rhythm: normal sinus rhythm  QRS Axis: normal  Intervals: normal  ST/T Wave abnormalities: nonspecific T wave changes  Conduction Disutrbances:none  Narrative Interpretation:   Old EKG Reviewed: none available    1. Trapezius muscle strain, left, subsequent encounter   2. Acute chest wall pain     Discharge Medication List as of 02/10/2013 10:55 AM    START taking these medications   Details  !! meloxicam (MOBIC) 15 MG tablet Take 1 tablet (15 mg total) by mouth daily., Starting 02/10/2013, Until Discontinued, Print    methocarbamol (ROBAXIN) 500 MG tablet Take 2 tablets (1,000 mg total) by mouth 4 (four) times daily., Starting 02/10/2013, Until Discontinued, Print     !! - Potential duplicate medications found. Please discuss with provider.        Plan discharge  Devoria Albe, MD, FACEP    MDM    I personally  performed the services described in this documentation, which was scribed in my presence. The recorded information has been reviewed and considered.  Devoria Albe, MD, [facility]    Ward Givens, MD 02/10/13 [id number]

## 2013-02-10 NOTE — ED Notes (Addendum)
Patient noticed trapezius pain after lifting weights week or more ago which restricted L head rotation.  After this subsided, continued to have intermittent pain in trapezius, L shoulder, L upper chest wall and shooting pain in L arm w/numbness to 2nd and 3rd fingers, more noticeable w/movement. Occasionally wakes him at night (sleeps w/L arm supporting head). He has visualized "muscle twitching" in top of L shoulder and L upper chest.  Full ROM in L shoulder, but movement elicited the sharp, shooting pain down arm.  No SOB, dyspnea, nausea or diaphoresis. No tenderness to cervical spine, trapezius or shoulder upon palpation.

## 2013-02-10 NOTE — ED Notes (Signed)
Patient with no complaints at this time. Respirations even and unlabored. Skin warm/dry. Discharge instructions reviewed with patient at this time. Patient given opportunity to voice concerns/ask questions. Patient discharged at this time and left Emergency Department with steady gait.   

## 2013-02-13 ENCOUNTER — Telehealth: Payer: Self-pay | Admitting: Physician Assistant

## 2013-02-13 NOTE — Telephone Encounter (Signed)
Did they do Xray at ER? If not f/u for films/OV

## 2013-02-20 NOTE — Telephone Encounter (Signed)
Discomfort has improved some but is not resolved.  He is taking the Robaxin but doesn't feel that it helps. Mobic helped with his elbow pain.   No x-rays or further testing was done at the ER.   Patient would like to f/u with Montey Hora, PA-C.  Appt scheduled for 02/26/13.  He will call if symptoms worsen.

## 2013-02-26 ENCOUNTER — Encounter: Payer: Self-pay | Admitting: Physician Assistant

## 2013-02-26 ENCOUNTER — Ambulatory Visit (INDEPENDENT_AMBULATORY_CARE_PROVIDER_SITE_OTHER): Payer: Commercial Indemnity

## 2013-02-26 ENCOUNTER — Ambulatory Visit (INDEPENDENT_AMBULATORY_CARE_PROVIDER_SITE_OTHER): Payer: Commercial Indemnity | Admitting: Physician Assistant

## 2013-02-26 VITALS — BP 154/69 | HR 54 | Temp 97.2°F | Ht 69.0 in | Wt 197.0 lb

## 2013-02-26 DIAGNOSIS — M542 Cervicalgia: Secondary | ICD-10-CM

## 2013-02-26 DIAGNOSIS — M792 Neuralgia and neuritis, unspecified: Secondary | ICD-10-CM

## 2013-02-26 DIAGNOSIS — IMO0002 Reserved for concepts with insufficient information to code with codable children: Secondary | ICD-10-CM

## 2013-02-26 MED ORDER — PREDNISONE (PAK) 10 MG PO TABS: 10.0000 mg | ORAL_TABLET | Freq: Every day | ORAL | Status: DC

## 2013-02-26 NOTE — Progress Notes (Signed)
Subjective:     Patient ID: [name] [name], male   DOB: [birth year], 52 y.o.   MRN: [id number]  HPI Pt here for f/U of neck and radicular arm pain Since our last visit he has laid off weightlifting Sx were still severe enough he went to the ER and was given rx of muscle relax. He states this did not help so he has stopped this as well Sx may have improved on their own some He has noted some weakness to the LUE since returning the gym Sx are not constant but nothing seems to trigger sx  Review of Systems  All other systems reviewed and are negative.       Objective:   Physical Exam  Nursing note and vitals reviewed. NO TTP of the trap of entire L shoulder area FROM of the shoulder Some sx with rest. Abduction Good grip strength Pulses/sensory good + Spurling sign Xray C-spine- Sl disc space narrowing at lower cervical levels     Assessment:      1. Neck pain   2. Radicular pain in left arm        Plan:     Rx for Sterapred dose pack Heat/Ice Massage Stretching INB MRI and possible referral

## 2013-02-26 NOTE — Patient Instructions (Signed)
Radicular Pain Radicular pain in either the arm or leg is usually from a bulging or herniated disk in the spine. A piece of the herniated disk may press against the nerves as the nerves exit the spine. This causes pain which is felt at the tips of the nerves down the arm or leg. Other causes of radicular pain may include:  Fractures.  Heart disease.  Cancer.  An abnormal and usually degenerative state of the nervous system or nerves (neuropathy). Diagnosis may require CT or MRI scanning to determine the primary cause.  Nerves that start at the neck (nerve roots) may cause radicular pain in the outer shoulder and arm. It can spread down to the thumb and fingers. The symptoms vary depending on which nerve root has been affected. In most cases radicular pain improves with conservative treatment. Neck problems may require physical therapy, a neck collar, or cervical traction. Treatment may take many weeks, and surgery may be considered if the symptoms do not improve.  Conservative treatment is also recommended for sciatica. Sciatica causes pain to radiate from the lower back or buttock area down the leg into the foot. Often there is a history of back problems. Most patients with sciatica are better after 2 to 4 weeks of rest and other supportive care. Short term bed rest can reduce the disk pressure considerably. Sitting, however, is not a good position since this increases the pressure on the disk. You should avoid bending, lifting, and all other activities which make the problem worse. Traction can be used in severe cases. Surgery is usually reserved for patients who do not improve within the first months of treatment. Only take over-the-counter or prescription medicines for pain, discomfort, or fever as directed by your caregiver. Narcotics and muscle relaxants may help by relieving more severe pain and spasm and by providing mild sedation. Cold or massage can give significant relief. Spinal manipulation  is not recommended. It can increase the degree of disc protrusion. Epidural steroid injections are often effective treatment for radicular pain. These injections deliver medicine to the spinal nerve in the space between the protective covering of the spinal cord and back bones (vertebrae). Your caregiver can give you more information about steroid injections. These injections are most effective when given within two weeks of the onset of pain.  You should see your caregiver for follow up care as recommended. A program for neck and back injury rehabilitation with stretching and strengthening exercises is an important part of management.  SEEK IMMEDIATE MEDICAL CARE IF:  You develop increased pain, weakness, or numbness in your arm or leg.  You develop difficulty with bladder or bowel control.  You develop abdominal pain. Document Released: 09/07/2004 Document Revised: 10/23/2011 Document Reviewed: 11/23/2008 [employer] Patient Information 2014 [employer].  

## 2013-03-24 ENCOUNTER — Other Ambulatory Visit: Payer: Self-pay | Admitting: *Deleted

## 2013-03-24 MED ORDER — MELOXICAM 15 MG PO TABS: 15.0000 mg | ORAL_TABLET | Freq: Every day | ORAL | Status: DC

## 2013-03-24 NOTE — Telephone Encounter (Signed)
LAST REFILL 02/15/13 FOR #5 AND BEFORE THAT #15. NOT SURE WHY ONLY GIVING THAT QUANTITY. IF POSSIBLE CAN WE REFILL FOR #30. LAST OV 7/14.

## 2013-04-17 ENCOUNTER — Ambulatory Visit (INDEPENDENT_AMBULATORY_CARE_PROVIDER_SITE_OTHER): Payer: Commercial Indemnity | Admitting: Family Medicine

## 2013-04-17 ENCOUNTER — Encounter: Payer: Self-pay | Admitting: Family Medicine

## 2013-04-17 VITALS — BP 157/73 | HR 73 | Temp 97.0°F | Ht 69.0 in | Wt 200.6 lb

## 2013-04-17 DIAGNOSIS — R030 Elevated blood-pressure reading, without diagnosis of hypertension: Secondary | ICD-10-CM | POA: Insufficient documentation

## 2013-04-17 DIAGNOSIS — B1910 Unspecified viral hepatitis B without hepatic coma: Secondary | ICD-10-CM

## 2013-04-17 DIAGNOSIS — B351 Tinea unguium: Secondary | ICD-10-CM | POA: Insufficient documentation

## 2013-04-17 DIAGNOSIS — Z0000 Encounter for general adult medical examination without abnormal findings: Secondary | ICD-10-CM | POA: Insufficient documentation

## 2013-04-17 DIAGNOSIS — L818 Other specified disorders of pigmentation: Secondary | ICD-10-CM | POA: Insufficient documentation

## 2013-04-17 DIAGNOSIS — B181 Chronic viral hepatitis B without delta-agent: Secondary | ICD-10-CM | POA: Insufficient documentation

## 2013-04-17 LAB — POCT URINALYSIS DIPSTICK
Bilirubin, UA: NEGATIVE
Blood, UA: NEGATIVE
Glucose, UA: NEGATIVE
Ketones, UA: NEGATIVE
Leukocytes, UA: NEGATIVE
Nitrite, UA: NEGATIVE
Protein, UA: NEGATIVE
Spec Grav, UA: 1.015
Urobilinogen, UA: NEGATIVE
pH, UA: 7

## 2013-04-17 LAB — POCT UA - MICROSCOPIC ONLY
Bacteria, U Microscopic: NEGATIVE
Casts, Ur, LPF, POC: NEGATIVE
Crystals, Ur, HPF, POC: NEGATIVE
RBC, urine, microscopic: NEGATIVE
WBC, Ur, HPF, POC: NEGATIVE
Yeast, UA: NEGATIVE

## 2013-04-17 NOTE — Patient Instructions (Addendum)
HEALTH MAINTENANCE Immunizations: Tetanus-Diphtheria Booster [phone] Pertusis Booster [phone] Flu Shot Due: every Fall Pneumonia Vaccine: usually at 53 years of age unless there are certain risk situations. Herpes Zoster/Shingles Vaccine due: usually at 53 years of age HPV [id number]:[id number] age 72 to 4 years in males and females.  Healthy Life Habits: Exercise Goal: 5-6 days/week; start gradually(ie 30 minutes/3days per week) Nutrition: Balanced healthy meals including Vegetables and Fruits. Consider  Reading the following books: 1) Eat to Live by Dr Ottis Stain; 2) Prevent and Reverse Heart Disease by Dr Suzzette Righter.  Vitamins:okay to take a multipvitamin Aspirin:n/a Stop Tobacco Use:n/a  Seat Belt Use:+++ recommended Sunscreen Use:+++ recommended      Recommended Screening Tests: Colon Cancer Screening:upto date Blood work: today Cholesterol Screening:   today         HIV:    n/a                Hepatitis C(people born 1945-1965):n/a already tested   Monthly Self Testicular Exam:+++  Eye Exam: every 1 to 2 years recommended Dental Health: at least every 6 months  Others:    Living Will/Healthcare Power of Attorney: should have this in order with your personal estate planning  Check Blood pressure daily if possible and record. And bring readings in 2 months. Today's reading by me was 145/90   DASH Diet The DASH diet stands for "Dietary Approaches to Stop Hypertension." It is a healthy eating plan that has been shown to reduce high blood pressure (hypertension) in as little as 14 days, while also possibly providing other significant health benefits. These other health benefits include reducing the risk of breast cancer after menopause and reducing the risk of type 2 diabetes, heart disease, colon cancer, and stroke. Health benefits also include weight loss and slowing kidney failure in patients with chronic kidney disease.  DIET GUIDELINES  Limit salt (sodium). Your  diet should contain less than 1500 mg of sodium daily.  Limit refined or processed carbohydrates. Your diet should include mostly whole grains. Desserts and added sugars should be used sparingly.  Include small amounts of heart-healthy fats. These types of fats include nuts, oils, and tub margarine. Limit saturated and trans fats. These fats have been shown to be harmful in the body. CHOOSING FOODS  The following food groups are based on a 2000 calorie diet. See your Registered Dietitian for individual calorie needs. Grains and Grain Products (6 to 8 servings daily)  Eat More Often: Whole-wheat bread, brown rice, whole-grain or wheat pasta, quinoa, popcorn without added fat or salt (air popped).  Eat Less Often: White bread, white pasta, white rice, cornbread. Vegetables (4 to 5 servings daily)  Eat More Often: Fresh, frozen, and canned vegetables. Vegetables may be raw, steamed, roasted, or grilled with a minimal amount of fat.  Eat Less Often/Avoid: Creamed or fried vegetables. Vegetables in a cheese sauce. Fruit (4 to 5 servings daily)  Eat More Often: All fresh, canned (in natural juice), or frozen fruits. Dried fruits without added sugar. One hundred percent fruit juice ( cup [237 mL] daily).  Eat Less Often: Dried fruits with added sugar. Canned fruit in light or heavy syrup. [employer], Fish, and Poultry (2 servings or less daily. One serving is 3 to 4 oz [85-114 g]).  Eat More Often: Ninety percent or leaner ground beef, tenderloin, sirloin. Round cuts of beef, chicken breast, [place] breast. All fish. Grill, bake, or broil your meat. Nothing should be fried.  Eat Less Often/Avoid:  Fatty cuts of meat, [place], or chicken leg, thigh, or wing. Fried cuts of meat or fish. Dairy (2 to 3 servings)  Eat More Often: Low-fat or fat-free milk, low-fat plain or light yogurt, reduced-fat or part-skim cheese.  Eat Less Often/Avoid: Milk (whole, 2%).Whole milk yogurt. Full-fat  cheeses. Nuts, Seeds, and Legumes (4 to 5 servings per week)  Eat More Often: All without added salt.  Eat Less Often/Avoid: Salted nuts and seeds, canned beans with added salt. Fats and Sweets (limited)  Eat More Often: Vegetable oils, tub margarines without trans fats, sugar-free gelatin. Mayonnaise and salad dressings.  Eat Less Often/Avoid: Coconut oils, palm oils, butter, stick margarine, cream, half and half, cookies, candy, pie. FOR MORE INFORMATION The Dash Diet Eating Plan: www.dashdiet.org Document Released: 07/20/2011 Document Revised: 10/23/2011 Document Reviewed: 07/20/2011 [facility] Patient Information 2014 [place], [place].   Vinegar and Vicks vaporub to nails at night.

## 2013-04-17 NOTE — Progress Notes (Signed)
Patient ID: DESMUND ELMAN, male   DOB: 1961-03-16, 52 y.o.   MRN: [id number] SUBJECTIVE: CC: Chief Complaint  Patient presents with  . Annual Exam    no complaints hx hep B had EKG 7-14    HPI: Annual PEX H/o chronic Hep B infection  Followed by the Liver clinic in [place].  Doing well so far. No complaints.  Past Medical History  Diagnosis Date  . Hep B w/o coma, chronic, w/o delta   . Tattoos    History reviewed. No pertinent past surgical history. History   Social History  . Marital Status: Married    Spouse Name: N/A    Number of Children: N/A  . Years of Education: N/A   Occupational History  . Not on file.   Social History Main Topics  . Smoking status: Never Smoker   . Smokeless tobacco: Not on file  . Alcohol Use: No  . Drug Use: No  . Sexual Activity: Not on file   Other Topics Concern  . Not on file   Social History Narrative  . No narrative on file   Family History  Problem Relation Age of Onset  . Diabetes Mother   . Hypertension Mother   . Diabetes Father    Current Outpatient Prescriptions on File Prior to Visit  Medication Sig Dispense Refill  . entecavir (BARACLUDE) 0.5 MG tablet Take 0.5 mg by mouth daily.        . meloxicam (MOBIC) 15 MG tablet Take 1 tablet (15 mg total) by mouth daily.  30 tablet  0  . tenofovir (VIREAD) 300 MG tablet Take 1 tablet (300 mg total) by mouth daily.  30 tablet  4   No current facility-administered medications on file prior to visit.   No Known Allergies Immunization History  Administered Date(s) Administered  . Tdap 03/15/2011   Prior to Admission medications   Medication Sig Start Date End Date Taking? Authorizing Provider  entecavir (BARACLUDE) 0.5 MG tablet Take 0.5 mg by mouth daily.     Yes Historical Provider, MD  meloxicam (MOBIC) 15 MG tablet Take 1 tablet (15 mg total) by mouth daily. 03/24/13  Yes Mary-Margaret Daphine Deutscher, FNP  tenofovir (VIREAD) 300 MG tablet Take 1 tablet (300 mg total)  by mouth daily. 10/12/11  Yes Aris Lot, MD    ROS: As above in the HPI. All other systems are stable or negative.  OBJECTIVE: APPEARANCE:  Patient in no acute distress.The patient appeared well nourished and normally developed. Acyanotic. Waist: VITAL SIGNS:BP 157/73  Pulse 73  Temp(Src) 97 F (36.1 C) (Oral)  Ht 5\' 9"  (1.753 m)  Wt 200 lb 9.6 oz (90.992 kg)  BMI 29.61 kg/m2 AAM Muscular and looks healthy  SKIN: warm and  Dry without overt rashes, He has tattoos on the upper extremities. No scars. 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  HEAD and Neck: without JVD, Head and scalp: normal Eyes:No scleral icterus. Fundi normal, eye movements normal. Ears: Auricle normal, canal normal, Tympanic membranes normal, insufflation normal. Nose: normal Throat: normal Neck & thyroid: normal  CHEST & LUNGS: Chest wall: normal Lungs: Clear  CVS: Reveals the PMI to be normally located. Regular rhythm, First and Second Heart sounds are normal,  absence of murmurs, rubs or gallops. Peripheral vasculature: Radial pulses: normal Dorsal pedis pulses: normal Posterior pulses: normal  ABDOMEN:  Appearance: normal Benign, no organomegaly, no masses, no Abdominal Aortic enlargement. No Guarding ,  no rebound. No Bruits. Bowel sounds: normal  RECTAL: Normal , prostate normal heme negative GU: normal  EXTREMETIES: nonedematous.  MUSCULOSKELETAL:  Spine: normal Joints: intact  NEUROLOGIC: oriented to time,place and person; nonfocal. Strength is normal Sensory is normal Reflexes are normal Cranial Nerves are normal.  ASSESSMENT: Annual physical exam - Plan: PSA, total and free, Lipid panel  Hepatitis B infection  Elevated blood pressure (not hypertension) - Plan: TSH, CMP14+EGFR, POCT urinalysis dipstick, POCT UA - Microscopic Only  Tattoos  Tinea unguium    PLAN:       HEALTH  MAINTENANCE Immunizations: Tetanus-Diphtheria Booster ZOX:[id number] Pertusis Booster AVW:[id number] Flu Shot Due: every Fall Pneumonia Vaccine: usually at 53 years of age unless there are certain risk situations. Herpes Zoster/Shingles Vaccine due: usually at 52 years of age HPV [id number]:[id number] age 38 to 72 years in males and females.  Healthy Life Habits: Exercise Goal: 5-6 days/week; start gradually(ie 30 minutes/3days per week) Nutrition: Balanced healthy meals including Vegetables and Fruits. Consider  Reading the following books: 1) Eat to Live by Dr Ottis Stain; 2) Prevent and Reverse Heart Disease by Dr Suzzette Righter.  Vitamins:okay to take a multipvitamin Aspirin:n/a Stop Tobacco Use:n/a  Seat Belt Use:+++ recommended Sunscreen Use:+++ recommended      Recommended Screening Tests: Colon Cancer Screening:upto date Blood work: today Cholesterol Screening:   today         HIV:    n/a                Hepatitis C(people born 1945-1965):n/a already tested   Monthly Self Testicular Exam:+++  Eye Exam: every 1 to 2 years recommended Dental Health: at least every 6 months  Others:    Living Will/Healthcare Power of Attorney: should have this in order with your personal estate planning  Check Blood pressure daily if possible and record. And bring readings in 2 months. Today's reading by me was 145/90   DASH Diet The DASH diet stands for "Dietary Approaches to Stop Hypertension." It is a healthy eating plan that has been shown to reduce high blood pressure (hypertension) in as little as 14 days, while also possibly providing other significant health benefits. These other health benefits include reducing the risk of breast cancer after menopause and reducing the risk of type 2 diabetes, heart disease, colon cancer, and stroke. Health benefits also include weight loss and slowing kidney failure in patients with chronic kidney disease.  DIET GUIDELINES  Limit salt (sodium). Your diet should  contain less than 1500 mg of sodium daily.  Limit refined or processed carbohydrates. Your diet should include mostly whole grains. Desserts and added sugars should be used sparingly.  Include small amounts of heart-healthy fats. These types of fats include nuts, oils, and tub margarine. Limit saturated and trans fats. These fats have been shown to be harmful in the body. CHOOSING FOODS  The following food groups are based on a 2000 calorie diet. See your Registered Dietitian for individual calorie needs. Grains and Grain Products (6 to 8 servings daily)  Eat More Often: Whole-wheat bread, brown rice, whole-grain or wheat pasta, quinoa, popcorn without added fat or salt (air popped).  Eat Less Often: White bread, white pasta, white rice, cornbread. Vegetables (4 to 5 servings daily)  Eat More Often: Fresh, frozen, and canned vegetables. Vegetables may be raw, steamed, roasted, or grilled with a minimal amount of fat.  Eat Less Often/Avoid: Creamed or fried vegetables. Vegetables in a cheese sauce. Fruit (4 to 5 servings daily)  Eat More Often: All fresh, canned (in natural juice), or frozen fruits. Dried fruits without added sugar. One hundred percent fruit juice ( cup [237 mL] daily).  Eat Less Often: Dried fruits with added sugar. Canned fruit in light or heavy syrup. [employer], Fish, and Poultry (2 servings or less daily. One serving is 3 to 4 oz [85-114 g]).  Eat More Often: Ninety percent or leaner ground beef, tenderloin, sirloin. Round cuts of beef, chicken breast, [place] breast. All fish. Grill, bake, or broil your meat. Nothing should be fried.  Eat Less Often/Avoid: Fatty cuts of meat, [place], or chicken leg, thigh, or wing. Fried cuts of meat or fish. Dairy (2 to 3 servings)  Eat More Often: Low-fat or fat-free milk, low-fat plain or light yogurt, reduced-fat or part-skim cheese.  Eat Less Often/Avoid: Milk (whole, 2%).Whole milk yogurt. Full-fat cheeses. Nuts, Seeds,  and Legumes (4 to 5 servings per week)  Eat More Often: All without added salt.  Eat Less Often/Avoid: Salted nuts and seeds, canned beans with added salt. Fats and Sweets (limited)  Eat More Often: Vegetable oils, tub margarines without trans fats, sugar-free gelatin. Mayonnaise and salad dressings.  Eat Less Often/Avoid: Coconut oils, palm oils, butter, stick margarine, cream, half and half, cookies, candy, pie. FOR MORE INFORMATION The Dash Diet Eating Plan: www.dashdiet.org Document Released: 07/20/2011 Document Revised: 10/23/2011 Document Reviewed: 07/20/2011 [facility] Patient Information 2014 [place], [place].  Vinegar and vicks vaporub to nails at night.  Orders Placed This Encounter  Procedures  . PSA, total and free  . TSH  . Lipid panel  . CMP14+EGFR  . POCT urinalysis dipstick  . POCT UA - Microscopic Only    Return in about 2 months (around 06/17/2013) for recheck BP.  Delphine Sizemore P. [place] Charon, M.D.

## 2013-04-18 LAB — CMP14+EGFR
ALT: 49 IU/L — ABNORMAL HIGH (ref 0–44)
AST: 52 IU/L — ABNORMAL HIGH (ref 0–40)
Albumin/Globulin Ratio: 1.8 (ref 1.1–2.5)
Albumin: 4.5 g/dL (ref 3.5–5.5)
Alkaline Phosphatase: 80 IU/L (ref 39–117)
BUN/Creatinine Ratio: 11 (ref 9–20)
BUN: 17 mg/dL (ref 6–24)
CO2: 25 mmol/L (ref 18–29)
Calcium: 9.7 mg/dL (ref 8.7–10.2)
Chloride: 99 mmol/L (ref 97–108)
Creatinine, Ser: 1.58 mg/dL — ABNORMAL HIGH (ref 0.76–1.27)
GFR calc Af Amer: 57 mL/min/{1.73_m2} — ABNORMAL LOW (ref >59)
GFR calc non Af Amer: 49 mL/min/{1.73_m2} — ABNORMAL LOW (ref >59)
Globulin, Total: 2.5 g/dL (ref 1.5–4.5)
Glucose: 101 mg/dL — ABNORMAL HIGH (ref 65–99)
Potassium: 4.8 mmol/L (ref 3.5–5.2)
Sodium: 141 mmol/L (ref 134–144)
Total Bilirubin: 0.5 mg/dL (ref 0.0–1.2)
Total Protein: 7 g/dL (ref 6.0–8.5)

## 2013-04-18 LAB — LIPID PANEL
Chol/HDL Ratio: 2.8 ratio (ref 0.0–5.0)
Cholesterol, Total: 141 mg/dL (ref 100–199)
HDL: 50 mg/dL
LDL Calculated: 83 mg/dL (ref 0–99)
Triglycerides: 39 mg/dL (ref 0–149)
VLDL Cholesterol Cal: 8 mg/dL (ref 5–40)

## 2013-04-18 LAB — PSA, TOTAL AND FREE
PSA, Free Pct: 42.2 %
PSA, Free: 0.38 ng/mL
PSA: 0.9 ng/mL (ref 0.0–4.0)

## 2013-04-18 LAB — TSH: TSH: 1.49 u[IU]/mL (ref 0.450–4.500)

## 2013-05-21 ENCOUNTER — Other Ambulatory Visit: Payer: Self-pay | Admitting: Nurse Practitioner

## 2013-05-23 NOTE — Telephone Encounter (Signed)
Prescription renewed in EPIC. 

## 2013-06-19 ENCOUNTER — Ambulatory Visit: Payer: Commercial Indemnity | Admitting: Family Medicine

## 2013-07-02 ENCOUNTER — Other Ambulatory Visit: Payer: Self-pay | Admitting: Internal Medicine

## 2013-07-02 DIAGNOSIS — C220 Liver cell carcinoma: Secondary | ICD-10-CM

## 2013-07-14 ENCOUNTER — Ambulatory Visit
Admission: RE | Admit: 2013-07-14 | Discharge: 2013-07-14 | Disposition: A | Discharge status: Home / Self Care | Payer: Managed Care, Other (non HMO) | Source: Ambulatory Visit | Attending: Internal Medicine | Admitting: Internal Medicine

## 2013-07-14 DIAGNOSIS — C220 Liver cell carcinoma: Secondary | ICD-10-CM

## 2013-08-15 ENCOUNTER — Ambulatory Visit (INDEPENDENT_AMBULATORY_CARE_PROVIDER_SITE_OTHER): Payer: Commercial Indemnity | Admitting: Family Medicine

## 2013-08-15 ENCOUNTER — Encounter: Payer: Self-pay | Admitting: Family Medicine

## 2013-08-15 VITALS — BP 153/84 | HR 81 | Temp 97.7°F | Ht 69.0 in | Wt 206.4 lb

## 2013-08-15 DIAGNOSIS — B1910 Unspecified viral hepatitis B without hepatic coma: Secondary | ICD-10-CM

## 2013-08-15 DIAGNOSIS — L818 Other specified disorders of pigmentation: Secondary | ICD-10-CM

## 2013-08-15 DIAGNOSIS — B181 Chronic viral hepatitis B without delta-agent: Secondary | ICD-10-CM

## 2013-08-15 DIAGNOSIS — N183 Chronic kidney disease, stage 3 unspecified: Secondary | ICD-10-CM

## 2013-08-15 DIAGNOSIS — R030 Elevated blood-pressure reading, without diagnosis of hypertension: Secondary | ICD-10-CM

## 2013-08-15 DIAGNOSIS — L819 Disorder of pigmentation, unspecified: Secondary | ICD-10-CM

## 2013-08-15 DIAGNOSIS — B351 Tinea unguium: Secondary | ICD-10-CM

## 2013-08-15 DIAGNOSIS — I10 Essential (primary) hypertension: Secondary | ICD-10-CM

## 2013-08-15 MED ORDER — AMLODIPINE BESYLATE 5 MG PO TABS: 5.0000 mg | ORAL_TABLET | Freq: Every day | ORAL | Status: DC

## 2013-08-15 NOTE — Patient Instructions (Signed)
Dr Paula Libra Recommendations  For nutrition information, I recommend books:  1).Eat to Live by Dr Excell Seltzer. 2).Prevent and Reverse Heart Disease by Dr Karl Luke. 3) Dr Janene Harvey Book:  Program to Reverse Diabetes  Exercise recommendations are:  If unable to walk, then the patient can exercise in a chair 3 times a day. By flapping arms like a bird gently and raising legs outwards to the front.  If ambulatory, the patient can go for walks for 30 minutes 3 times a week. Then increase the intensity and duration as tolerated.  Goal is to try to attain exercise frequency to 5 times a week.  If applicable: Best to perform resistance exercises (machines or weights) 2 days a week and cardio type exercises 3 days per week.  Chronic Kidney Disease Chronic kidney disease occurs when the kidneys are damaged over a long period. The kidneys are two organs that lie on either side of the spine between the middle of the back and the front of the abdomen. The kidneys:   Remove wastes and extra water from the blood.   Produce important hormones. These help keep bones strong, regulate blood pressure, and help create red blood cells.   Balance the fluids and chemicals in the blood and tissues. A small amount of kidney damage may not cause problems, but a large amount of damage may make it difficult or impossible for the kidneys to work the way they should. If steps are not taken to slow down the kidney damage or stop it from getting worse, the kidneys may stop working permanently. Most of the time, chronic kidney disease does not go away. However, it can often be controlled, and those with the disease can usually live normal lives. CAUSES  The most common causes of chronic kidney disease are diabetes and high blood pressure (hypertension). Chronic kidney disease may also be caused by:   Diseases that cause kidneys' filters to become inflamed.   Diseases that affect the immune  system.   Genetic diseases.   Medicines that damage the kidneys, such as anti-inflammatory medicines.  Poisoning or exposure to toxic substances.   A reoccurring kidney or urinary infection.   A problem with urine flow. This may be caused by:   Cancer.   Kidney stones.   An enlarged prostate in males. SYMPTOMS  Because the kidney damage in chronic kidney disease occurs slowly, symptoms develop slowly and may not be obvious until the kidney damage becomes severe. A person may have a kidney disease for years without showing any symptoms. Symptoms can include:   Swelling (edema) of the legs, ankles, or feet.   Tiredness (lethargy).   Nausea or vomiting.   Confusion.   Problems with urination, such as:   Decreased urine production.   Frequent urination, especially at night.   Frequent accidents in children who are potty trained.   Muscle twitches and cramps.   Shortness of breath.  Weakness.   Persistent itchiness.   Loss of appetite.  Metallic taste in the mouth.  Trouble sleeping.  Slowed development in children.  Short stature in children. DIAGNOSIS  Chronic kidney disease may be detected and diagnosed by tests, including blood, urine, imaging, or kidney biopsy tests.  TREATMENT  Most chronic kidney diseases cannot be cured. Treatment usually involves relieving symptoms and preventing or slowing the progression of the disease. Treatment may include:   A special diet. You may need to avoid alcohol and foods thatare salty  and high in potassium.   Medicines. These may:   Lower blood pressure.   Relieve anemia.   Relieve swelling.   Protect the bones. HOME CARE INSTRUCTIONS   Follow your prescribed diet.   Only take over-the-counter or prescription medicines as directed by your caregiver.  Do not take any new medicines (prescription, over-the-counter, or nutritional supplements) unless approved by your caregiver. Many  medicines can worsen your kidney damage or need to have the dose adjusted.   Quit smoking if you are a smoker. Talk to your caregiver about a smoking cessation program.   Keep all follow-up appointments as directed by your caregiver. SEEK IMMEDIATE MEDICAL CARE IF:  Your symptoms get worse or you develop new symptoms.   You develop symptoms of end-stage kidney disease. These include:   Headaches.   Abnormally dark or light skin.   Numbness in the hands or feet.   Easy bruising.   Frequent hiccups.   Menstruation stops.   You have a fever.   You have decreased urine production.   You havepain or bleeding when urinating. MAKE SURE YOU:  Understand these instructions.  Will watch your condition.  Will get help right away if you are not doing well or get worse. FOR MORE INFORMATION  American Association of Kidney Patients: BombTimer.gl National Kidney Foundation: www.kidney.[place]: https://mathis.com/ [facility]: www.lifeoptions.org and www.kidneyschool.org Document Released: 05/09/2008 Document Revised: 07/17/2012 Document Reviewed: 03/29/2012 [facility] Patient Information 2014 [place], [place].  DASH Diet The DASH diet stands for "Dietary Approaches to Stop Hypertension." It is a healthy eating plan that has been shown to reduce high blood pressure (hypertension) in as little as 14 days, while also possibly providing other significant health benefits. These other health benefits include reducing the risk of breast cancer after menopause and reducing the risk of type 2 diabetes, heart disease, colon cancer, and stroke. Health benefits also include weight loss and slowing kidney failure in patients with chronic kidney disease.  DIET GUIDELINES  Limit salt (sodium). Your diet should contain less than 1500 mg of sodium daily.  Limit refined or processed carbohydrates. Your diet should include mostly whole grains. Desserts and  added sugars should be used sparingly.  Include small amounts of heart-healthy fats. These types of fats include nuts, oils, and tub margarine. Limit saturated and trans fats. These fats have been shown to be harmful in the body. CHOOSING FOODS  The following food groups are based on a 2000 calorie diet. See your Registered Dietitian for individual calorie needs. Grains and Grain Products (6 to 8 servings daily)  Eat More Often: Whole-wheat bread, brown rice, whole-grain or wheat pasta, quinoa, popcorn without added fat or salt (air popped).  Eat Less Often: White bread, white pasta, white rice, cornbread. Vegetables (4 to 5 servings daily)  Eat More Often: Fresh, frozen, and canned vegetables. Vegetables may be raw, steamed, roasted, or grilled with a minimal amount of fat.  Eat Less Often/Avoid: Creamed or fried vegetables. Vegetables in a cheese sauce. Fruit (4 to 5 servings daily)  Eat More Often: All fresh, canned (in natural juice), or frozen fruits. Dried fruits without added sugar. One hundred percent fruit juice ( cup [237 mL] daily).  Eat Less Often: Dried fruits with added sugar. Canned fruit in light or heavy syrup. [employer], Fish, and Poultry (2 servings or less daily. One serving is 3 to 4 oz [85-114 g]).  Eat More Often: Ninety percent or leaner ground beef, tenderloin, sirloin. Round  cuts of beef, chicken breast, [place] breast. All fish. Grill, bake, or broil your meat. Nothing should be fried.  Eat Less Often/Avoid: Fatty cuts of meat, [place], or chicken leg, thigh, or wing. Fried cuts of meat or fish. Dairy (2 to 3 servings)  Eat More Often: Low-fat or fat-free milk, low-fat plain or light yogurt, reduced-fat or part-skim cheese.  Eat Less Often/Avoid: Milk (whole, 2%).Whole milk yogurt. Full-fat cheeses. Nuts, Seeds, and Legumes (4 to 5 servings per week)  Eat More Often: All without added salt.  Eat Less Often/Avoid: Salted nuts and seeds, canned beans with  added salt. Fats and Sweets (limited)  Eat More Often: Vegetable oils, tub margarines without trans fats, sugar-free gelatin. Mayonnaise and salad dressings.  Eat Less Often/Avoid: Coconut oils, palm oils, butter, stick margarine, cream, half and half, cookies, candy, pie. FOR MORE INFORMATION The Dash Diet Eating Plan: www.dashdiet.org Document Released: 07/20/2011 Document Revised: 10/23/2011 Document Reviewed: 07/20/2011 [facility] Patient Information 2014 [place], [place].   Hypertension As your heart beats, it forces blood through your arteries. This force is your blood pressure. If the pressure is too high, it is called hypertension (HTN) or high blood pressure. HTN is dangerous because you may have it and not know it. High blood pressure may mean that your heart has to work harder to pump blood. Your arteries may be narrow or stiff. The extra work puts you at risk for heart disease, stroke, and other problems.  Blood pressure consists of two numbers, a higher number over a lower, 110/72, for example. It is stated as "110 over 72." The ideal is below 120 for the top number (systolic) and under 80 for the bottom (diastolic). Write down your blood pressure today. You should pay close attention to your blood pressure if you have certain conditions such as: Heart failure. Prior heart attack. Diabetes Chronic kidney disease. Prior stroke. Multiple risk factors for heart disease. To see if you have HTN, your blood pressure should be measured while you are seated with your arm held at the level of the heart. It should be measured at least twice. A one-time elevated blood pressure reading (especially in the Emergency Department) does not mean that you need treatment. There may be conditions in which the blood pressure is different between your right and left arms. It is important to see your caregiver soon for a recheck. Most people have essential hypertension which means that there is not a  specific cause. This type of high blood pressure may be lowered by changing lifestyle factors such as: Stress. Smoking. Lack of exercise. Excessive weight. Drug/tobacco/alcohol use. Eating less salt. Most people do not have symptoms from high blood pressure until it has caused damage to the body. Effective treatment can often prevent, delay or reduce that damage. TREATMENT  When a cause has been identified, treatment for high blood pressure is directed at the cause. There are a large number of medications to treat HTN. These fall into several categories, and your caregiver will help you select the medicines that are best for you. Medications may have side effects. You should review side effects with your caregiver. If your blood pressure stays high after you have made lifestyle changes or started on medicines,  Your medication(s) may need to be changed. Other problems may need to be addressed. Be certain you understand your prescriptions, and know how and when to take your medicine. Be sure to follow up with your caregiver within the time frame advised (usually within two  weeks) to have your blood pressure rechecked and to review your medications. If you are taking more than one medicine to lower your blood pressure, make sure you know how and at what times they should be taken. Taking two medicines at the same time can result in blood pressure that is too low. SEEK IMMEDIATE MEDICAL CARE IF: You develop a severe headache, blurred or changing vision, or confusion. You have unusual weakness or numbness, or a faint feeling. You have severe chest or abdominal pain, vomiting, or breathing problems. MAKE SURE YOU:  Understand these instructions. Will watch your condition. Will get help right away if you are not doing well or get worse. Document Released: 07/31/2005 Document Revised: 10/23/2011 Document Reviewed: 03/20/2008 [facility] Patient Information 2014 [place].

## 2013-08-15 NOTE — Progress Notes (Signed)
Patient ID: DELOYD [name], male   DOB: 03/29/1961, 53 y.o.   MRN: [id number] SUBJECTIVE: CC: Chief Complaint  Patient presents with  . Follow-up    2 month follow no complaints reck bp    HPI:  Patient is here for follow up of hypertension/hep B infection/ denies Headache;deniesChest Pain;denies weakness;denies Shortness of Breath or Orthopnea;denies Visual changes;denies palpitations;denies cough;denies pedal edema;denies symptoms of TIA or stroke; admits to Compliance with medications. denies Problems with medications.  Wanted to delay BP medications and that he was going to change his eating and lack of adequate exercise.   Past Medical History  Diagnosis Date  . Hep B w/o coma, chronic, w/o delta   . Tattoos   . Hypertension    No past surgical history on file. History   Social History  . Marital Status: Divorced    Spouse Name: N/A    Number of Children: N/A  . Years of Education: N/A   Occupational History  . Not on file.   Social History Main Topics  . Smoking status: Never Smoker   . Smokeless tobacco: Not on file  . Alcohol Use: No  . Drug Use: No  . Sexual Activity: Not on file   Other Topics Concern  . Not on file   Social History Narrative  . No narrative on file   Family History  Problem Relation Age of Onset  . Diabetes Mother   . Hypertension Mother   . Diabetes Father    Current Outpatient Prescriptions on File Prior to Visit  Medication Sig Dispense Refill  . entecavir (BARACLUDE) 0.5 MG tablet Take 0.5 mg by mouth daily.        Marland Kitchen tenofovir (VIREAD) 300 MG tablet Take 1 tablet (300 mg total) by mouth daily.  30 tablet  4  . meloxicam (MOBIC) 15 MG tablet TAKE 1 TABLET (15 MG TOTAL) BY MOUTH DAILY.  30 tablet  0   No current facility-administered medications on file prior to visit.   No Known Allergies Immunization History  Administered Date(s) Administered  . Tdap 03/15/2011   Prior to Admission medications   Medication Sig  Start Date End Date Taking? Authorizing Provider  entecavir (BARACLUDE) 0.5 MG tablet Take 0.5 mg by mouth daily.     Yes Historical Provider, MD  tenofovir (VIREAD) 300 MG tablet Take 1 tablet (300 mg total) by mouth daily. 10/12/11  Yes Ileene Patrick, MD  meloxicam (MOBIC) 15 MG tablet TAKE 1 TABLET (15 MG TOTAL) BY MOUTH DAILY. 05/21/13   Vernie Shanks, MD     ROS: As above in the HPI. All other systems are stable or negative.  OBJECTIVE: APPEARANCE:  Patient in no acute distress.The patient appeared well nourished and normally developed. Acyanotic. Waist: VITAL SIGNS:BP 153/84  Pulse 81  Temp(Src) 97.7 F (36.5 C) (Oral)  Ht _0  (1.753 m)  Wt 206 lb 6.4 oz (93.622 kg)  BMI 30.47 kg/m2 AAM   SKIN: warm and  Dry without overt rashes, tattoos and scars  HEAD and Neck: without JVD, Head and scalp: normal Eyes:No scleral icterus. Fundi normal, eye movements normal. Ears: Auricle normal, canal normal, Tympanic membranes normal, insufflation normal. Nose: normal Throat: normal Neck & thyroid: normal  CHEST & LUNGS: Chest wall: normal Lungs: Clear  CVS: Reveals the PMI to be normally located. Regular rhythm, First and Second Heart sounds are normal,  absence of murmurs, rubs or gallops. Peripheral vasculature: Radial pulses: normal Dorsal pedis pulses: normal Posterior  pulses: normal  ABDOMEN:  Appearance: normal Benign, no organomegaly, no masses, no Abdominal Aortic enlargement. No Guarding , no rebound. No Bruits. Bowel sounds: normal  RECTAL: N/A GU: N/A  EXTREMETIES: nonedematous.  MUSCULOSKELETAL:  Spine: normal Joints: intact  NEUROLOGIC: oriented to time,place and person; nonfocal. Strength is normal Sensory is normal Reflexes are normal Cranial Nerves are normal.  Results for orders placed in visit on 04/17/13  PSA, TOTAL AND FREE      Result Value Range   PSA 0.9  0.0 - 4.0 ng/mL   PSA, Free 0.38  N/A ng/mL   PSA, Free Pct 42.2    TSH       Result Value Range   TSH 1.490  0.450 - 4.500 uIU/mL  LIPID PANEL      Result Value Range   Cholesterol, Total 141  100 - 199 mg/dL   Triglycerides 39  0 - 149 mg/dL   HDL 50  >39 mg/dL   VLDL Cholesterol Cal 8  5 - 40 mg/dL   LDL Calculated 83  0 - 99 mg/dL   Chol/HDL Ratio 2.8  0.0 - 5.0 ratio units  CMP14+EGFR      Result Value Range   Glucose 101 (*) 65 - 99 mg/dL   BUN 17  6 - 24 mg/dL   Creatinine, Ser 1.58 (*) 0.76 - 1.27 mg/dL   GFR calc non Af Amer 49 (*) >59 mL/min/1.73   GFR calc Af Amer 57 (*) >59 mL/min/1.73   BUN/Creatinine Ratio 11  9 - 20   Sodium 141  134 - 144 mmol/L   Potassium 4.8  3.5 - 5.2 mmol/L   Chloride 99  97 - 108 mmol/L   CO2 25  18 - 29 mmol/L   Calcium 9.7  8.7 - 10.2 mg/dL   Total Protein 7.0  6.0 - 8.5 g/dL   Albumin 4.5  3.5 - 5.5 g/dL   Globulin, Total 2.5  1.5 - 4.5 g/dL   Albumin/Globulin Ratio 1.8  1.1 - 2.5   Total Bilirubin 0.5  0.0 - 1.2 mg/dL   Alkaline Phosphatase 80  39 - 117 IU/L   AST 52 (*) 0 - 40 IU/L   ALT 49 (*) 0 - 44 IU/L  POCT URINALYSIS DIPSTICK      Result Value Range   Color, UA YELLOW     Clarity, UA CLEAR     Glucose, UA NEG     Bilirubin, UA NEG     Ketones, UA NEG     Spec Grav, UA 1.015     Blood, UA NEG     pH, UA 7.0     Protein, UA NEG     Urobilinogen, UA negative     Nitrite, UA NEG     Leukocytes, UA Negative    POCT UA - MICROSCOPIC ONLY      Result Value Range   WBC, Ur, HPF, POC neg     RBC, urine, microscopic neg     Bacteria, U Microscopic neg     Mucus, UA mod     Epithelial cells, urine per micros occ     Crystals, Ur, HPF, POC neg     Casts, Ur, LPF, POC neg     Yeast, UA neg      ASSESSMENT: Tinea unguium  Tattoos  Hepatitis B infection  Elevated blood pressure (not hypertension)  Hep B w/o coma, chronic, w/o delta  Hypertension - Plan: amLODipine (NORVASC) 5 MG tablet  CKD (chronic kidney disease) stage 3, GFR 30-59 ml/min  PLAN: Discussed with him the CKD and  the significance and that we should not wait any further to treat his HYN.  No orders of the defined types were placed in this encounter.   Meds ordered this encounter  Medications  . amLODipine (NORVASC) 5 MG tablet    Sig: Take 1 tablet (5 mg total) by mouth daily.    Dispense:  30 tablet    Refill:  5    Handouts in the AVS on CKD,HTN and        Dr Paula Libra Recommendations  For nutrition information, I recommend books:  1).Eat to Live by Dr Excell Seltzer. 2).Prevent and Reverse Heart Disease by Dr Karl Luke. 3) Dr Janene Harvey Book:  Program to Reverse Diabetes  Exercise recommendations are:  If unable to walk, then the patient can exercise in a chair 3 times a day. By flapping arms like a bird gently and raising legs outwards to the front.  If ambulatory, the patient can go for walks for 30 minutes 3 times a week. Then increase the intensity and duration as tolerated.  Goal is to try to attain exercise frequency to 5 times a week.  If applicable: Best to perform resistance exercises (machines or weights) 2 days a week and cardio type exercises 3 days per week.  There are no discontinued medications. Return in about 6 weeks (around 09/26/2013) for recheck BP.  [place] Throckmorton P. Jacelyn Grip, M.D.

## 2013-11-07 ENCOUNTER — Encounter (INDEPENDENT_AMBULATORY_CARE_PROVIDER_SITE_OTHER): Payer: Self-pay

## 2013-11-07 ENCOUNTER — Ambulatory Visit (INDEPENDENT_AMBULATORY_CARE_PROVIDER_SITE_OTHER): Payer: Commercial Indemnity | Admitting: Family Medicine

## 2013-11-07 ENCOUNTER — Encounter: Payer: Self-pay | Admitting: Family Medicine

## 2013-11-07 VITALS — BP 137/74 | HR 71 | Temp 97.8°F | Ht 69.0 in | Wt 199.6 lb

## 2013-11-07 DIAGNOSIS — E785 Hyperlipidemia, unspecified: Secondary | ICD-10-CM | POA: Insufficient documentation

## 2013-11-07 DIAGNOSIS — I10 Essential (primary) hypertension: Secondary | ICD-10-CM

## 2013-11-07 DIAGNOSIS — N183 Chronic kidney disease, stage 3 unspecified: Secondary | ICD-10-CM

## 2013-11-07 DIAGNOSIS — B181 Chronic viral hepatitis B without delta-agent: Secondary | ICD-10-CM

## 2013-11-07 DIAGNOSIS — L818 Other specified disorders of pigmentation: Secondary | ICD-10-CM

## 2013-11-07 DIAGNOSIS — L819 Disorder of pigmentation, unspecified: Secondary | ICD-10-CM

## 2013-11-07 MED ORDER — AMLODIPINE BESYLATE 5 MG PO TABS: 7.5000 mg | ORAL_TABLET | Freq: Every day | ORAL | Status: DC

## 2013-11-07 NOTE — Patient Instructions (Signed)

## 2013-11-07 NOTE — Progress Notes (Signed)
Patient ID: [name] [name], male   DOB: 12/14/1960, 53 y.o.   MRN: [id number] SUBJECTIVE: CC: Chief Complaint  Patient presents with  . Follow-up    6 wk ck up bp and need kidney function tests  states rt elbow still hurts    HPI:  Patient is here for follow up of hypertension/hepB/CKD: denies Headache;deniesChest Pain;denies weakness;denies Shortness of Breath or Orthopnea;denies Visual changes;denies palpitations;denies cough;denies pedal edema;denies symptoms of TIA or stroke; admits to Compliance with medications. denies Problems with medications.  Feels good doing great. Strained right elbow with weight training getting better as he eased up on his work outs.  Hep B treatments doing great.   Past Medical History  Diagnosis Date  . Hep B w/o coma, chronic, w/o delta   . Tattoos   . Hypertension    No past surgical history on file. History   Social History  . Marital Status: Divorced    Spouse Name: N/A    Number of Children: N/A  . Years of Education: N/A   Occupational History  . Not on file.   Social History Main Topics  . Smoking status: Never Smoker   . Smokeless tobacco: Not on file  . Alcohol Use: No  . Drug Use: No  . Sexual Activity: Not on file   Other Topics Concern  . Not on file   Social History Narrative  . No narrative on file   Family History  Problem Relation Age of Onset  . Diabetes Mother   . Hypertension Mother   . Diabetes Father    Current Outpatient Prescriptions on File Prior to Visit  Medication Sig Dispense Refill  . entecavir (BARACLUDE) 0.5 MG tablet Take 0.5 mg by mouth daily.        Marland Kitchen tenofovir (VIREAD) 300 MG tablet Take 1 tablet (300 mg total) by mouth daily.  30 tablet  4   No current facility-administered medications on file prior to visit.   No Known Allergies Immunization History  Administered Date(s) Administered  . Tdap 03/15/2011   Prior to Admission medications   Medication Sig Start Date End Date  Taking? Authorizing Provider  amLODipine (NORVASC) 5 MG tablet Take 1 tablet (5 mg total) by mouth daily. 08/15/13   Vernie Shanks, MD  entecavir (BARACLUDE) 0.5 MG tablet Take 0.5 mg by mouth daily.      Historical Provider, MD  meloxicam (MOBIC) 15 MG tablet TAKE 1 TABLET (15 MG TOTAL) BY MOUTH DAILY. 05/21/13   Vernie Shanks, MD  tenofovir (VIREAD) 300 MG tablet Take 1 tablet (300 mg total) by mouth daily. 10/12/11   Ileene Patrick, MD     ROS: As above in the HPI. All other systems are stable or negative.  OBJECTIVE: APPEARANCE:  Patient in no acute distress.The patient appeared well nourished and normally developed. Acyanotic. Waist: VITAL SIGNS:BP 137/74  Pulse 71  Temp(Src) 97.8 F (36.6 C) (Oral)  Ht 5' 9"  (1.753 m)  Wt 199 lb 9.6 oz (90.538 kg)  BMI 29.46 kg/m2 AAM muscular. Looks well  SKIN: warm and  Dry without overt rashes, Tattoos.  HEAD and Neck: without JVD, Head and scalp: normal Eyes:No scleral icterus. Fundi normal, eye movements normal. Ears: Auricle normal, canal normal, Tympanic membranes normal, insufflation normal. Nose: normal Throat: normal Neck & thyroid: normal  CHEST & LUNGS: Chest wall: normal Lungs: Clear  CVS: Reveals the PMI to be normally located. Regular rhythm, First and Second Heart sounds are normal,  absence  of murmurs, rubs or gallops. Peripheral vasculature: Radial pulses: normal Dorsal pedis pulses: normal Posterior pulses: normal  ABDOMEN:  Appearance: normal Benign, no organomegaly, no masses, no Abdominal Aortic enlargement. No Guarding , no rebound. No Bruits. Bowel sounds: normal  RECTAL: N/A GU: N/A  EXTREMETIES: nonedematous.  MUSCULOSKELETAL:  Spine: normal Joints: intact  NEUROLOGIC: oriented to time,place and person; nonfocal. Strength is normal Sensory is normal Reflexes are normal Cranial Nerves are normal.  Results for orders placed in visit on 04/17/13  PSA, TOTAL AND FREE      Result Value  Ref Range   PSA 0.9  0.0 - 4.0 ng/mL   PSA, Free 0.38  N/A ng/mL   PSA, Free Pct 42.2    TSH      Result Value Ref Range   TSH 1.490  0.450 - 4.500 uIU/mL  LIPID PANEL      Result Value Ref Range   Cholesterol, Total 141  100 - 199 mg/dL   Triglycerides 39  0 - 149 mg/dL   HDL 50  >39 mg/dL   VLDL Cholesterol Cal 8  5 - 40 mg/dL   LDL Calculated 83  0 - 99 mg/dL   Chol/HDL Ratio 2.8  0.0 - 5.0 ratio units  CMP14+EGFR      Result Value Ref Range   Glucose 101 (*) 65 - 99 mg/dL   BUN 17  6 - 24 mg/dL   Creatinine, Ser 1.58 (*) 0.76 - 1.27 mg/dL   GFR calc non Af Amer 49 (*) >59 mL/min/1.73   GFR calc Af Amer 57 (*) >59 mL/min/1.73   BUN/Creatinine Ratio 11  9 - 20   Sodium 141  134 - 144 mmol/L   Potassium 4.8  3.5 - 5.2 mmol/L   Chloride 99  97 - 108 mmol/L   CO2 25  18 - 29 mmol/L   Calcium 9.7  8.7 - 10.2 mg/dL   Total Protein 7.0  6.0 - 8.5 g/dL   Albumin 4.5  3.5 - 5.5 g/dL   Globulin, Total 2.5  1.5 - 4.5 g/dL   Albumin/Globulin Ratio 1.8  1.1 - 2.5   Total Bilirubin 0.5  0.0 - 1.2 mg/dL   Alkaline Phosphatase 80  39 - 117 IU/L   AST 52 (*) 0 - 40 IU/L   ALT 49 (*) 0 - 44 IU/L  POCT URINALYSIS DIPSTICK      Result Value Ref Range   Color, UA YELLOW     Clarity, UA CLEAR     Glucose, UA NEG     Bilirubin, UA NEG     Ketones, UA NEG     Spec Grav, UA 1.015     Blood, UA NEG     pH, UA 7.0     Protein, UA NEG     Urobilinogen, UA negative     Nitrite, UA NEG     Leukocytes, UA Negative    POCT UA - MICROSCOPIC ONLY      Result Value Ref Range   WBC, Ur, HPF, POC neg     RBC, urine, microscopic neg     Bacteria, U Microscopic neg     Mucus, UA mod     Epithelial cells, urine per micros occ     Crystals, Ur, HPF, POC neg     Casts, Ur, LPF, POC neg     Yeast, UA neg      ASSESSMENT:  Hypertension - Plan: amLODipine (NORVASC) 5 MG tablet, CMP14+EGFR  Hep B w/o coma, chronic, w/o delta  CKD (chronic kidney disease) stage 3, GFR 30-59  ml/min  Tattoos  Dyslipidemia - Plan: CMP14+EGFR, NMR, lipoprofile  BP better but not at goal.  PLAN:  DASH diet Patient has embarked on the [employer] well.  Handouts on HTN and DASH diet in the AVS.  Orders Placed This Encounter  Procedures  . CMP14+EGFR  . NMR, lipoprofile   Meds ordered this encounter  Medications  . amLODipine (NORVASC) 5 MG tablet    Sig: Take 1.5 tablets (7.5 mg total) by mouth daily.    Dispense:  45 tablet    Refill:  5   Medications Discontinued During This Encounter  Medication Reason  . meloxicam (MOBIC) 15 MG tablet Completed Course  . amLODipine (NORVASC) 5 MG tablet Reorder   Return in about 4 weeks (around 12/05/2013) for Recheck medical problems, recheck BP. after the increase of the amlodipine.  Fredie Majano P. Jacelyn Grip, M.D.

## 2013-11-09 LAB — CMP14+EGFR
ALT: 53 IU/L — ABNORMAL HIGH (ref 0–44)
AST: 54 IU/L — ABNORMAL HIGH (ref 0–40)
Albumin/Globulin Ratio: 1.8 (ref 1.1–2.5)
Albumin: 4.6 g/dL (ref 3.5–5.5)
Alkaline Phosphatase: 85 IU/L (ref 39–117)
BUN/Creatinine Ratio: 11 (ref 9–20)
BUN: 17 mg/dL (ref 6–24)
CO2: 25 mmol/L (ref 18–29)
Calcium: 9.2 mg/dL (ref 8.7–10.2)
Chloride: 101 mmol/L (ref 97–108)
Creatinine, Ser: 1.53 mg/dL — ABNORMAL HIGH (ref 0.76–1.27)
GFR calc Af Amer: 59 mL/min/{1.73_m2} — ABNORMAL LOW (ref >59)
GFR calc non Af Amer: 51 mL/min/{1.73_m2} — ABNORMAL LOW (ref >59)
Globulin, Total: 2.6 g/dL (ref 1.5–4.5)
Glucose: 92 mg/dL (ref 65–99)
Potassium: 4.4 mmol/L (ref 3.5–5.2)
Sodium: 141 mmol/L (ref 134–144)
Total Bilirubin: 0.3 mg/dL (ref 0.0–1.2)
Total Protein: 7.2 g/dL (ref 6.0–8.5)

## 2013-11-09 LAB — NMR, LIPOPROFILE
Cholesterol: 120 mg/dL (ref <200)
HDL Cholesterol by NMR: 41 mg/dL (ref >=40)
HDL Particle Number: 26.5 umol/L — ABNORMAL LOW (ref >=30.5)
LDL Particle Number: 780 nmol/L (ref <1000)
LDL Size: 21.4 nm (ref >20.5)
LDLC SERPL CALC-MCNC: 66 mg/dL (ref <100)
LP-IR Score: 26 (ref <=45)
Small LDL Particle Number: 113 nmol/L (ref <=527)
Triglycerides by NMR: 66 mg/dL (ref <150)

## 2013-11-11 ENCOUNTER — Telehealth: Payer: Self-pay | Admitting: Family Medicine

## 2013-11-11 NOTE — Telephone Encounter (Signed)
Patient aware of labs.  

## 2013-11-23 ENCOUNTER — Other Ambulatory Visit: Payer: Self-pay | Admitting: Family Medicine

## 2013-11-24 NOTE — Telephone Encounter (Signed)
Refill denied.it was discontinued recently. She has stage 3 kidney disease and she needs to be careful with medications. Use tylenol prn pain Bring all medications at next office visit.

## 2013-11-24 NOTE — Telephone Encounter (Signed)
Not on med list

## 2013-11-27 NOTE — Telephone Encounter (Signed)
Called and left message for pt to return call to sch appt

## 2013-12-08 ENCOUNTER — Ambulatory Visit (INDEPENDENT_AMBULATORY_CARE_PROVIDER_SITE_OTHER): Payer: Commercial Indemnity | Admitting: Family Medicine

## 2013-12-08 ENCOUNTER — Encounter: Payer: Self-pay | Admitting: Family Medicine

## 2013-12-08 VITALS — BP 130/80 | HR 63 | Temp 97.6°F | Ht 69.0 in | Wt 200.0 lb

## 2013-12-08 DIAGNOSIS — L818 Other specified disorders of pigmentation: Secondary | ICD-10-CM

## 2013-12-08 DIAGNOSIS — I10 Essential (primary) hypertension: Secondary | ICD-10-CM

## 2013-12-08 DIAGNOSIS — E785 Hyperlipidemia, unspecified: Secondary | ICD-10-CM

## 2013-12-08 DIAGNOSIS — N183 Chronic kidney disease, stage 3 unspecified: Secondary | ICD-10-CM

## 2013-12-08 DIAGNOSIS — B351 Tinea unguium: Secondary | ICD-10-CM

## 2013-12-08 DIAGNOSIS — R030 Elevated blood-pressure reading, without diagnosis of hypertension: Secondary | ICD-10-CM

## 2013-12-08 DIAGNOSIS — B1910 Unspecified viral hepatitis B without hepatic coma: Secondary | ICD-10-CM

## 2013-12-08 DIAGNOSIS — L819 Disorder of pigmentation, unspecified: Secondary | ICD-10-CM

## 2013-12-08 NOTE — Patient Instructions (Signed)
DASH Diet  The DASH diet stands for "Dietary Approaches to Stop Hypertension." It is a healthy eating plan that has been shown to reduce high blood pressure (hypertension) in as little as 14 days, while also possibly providing other significant health benefits. These other health benefits include reducing the risk of breast cancer after menopause and reducing the risk of type 2 diabetes, heart disease, colon cancer, and stroke. Health benefits also include weight loss and slowing kidney failure in patients with chronic kidney disease.   DIET GUIDELINES  · Limit salt (sodium). Your diet should contain less than 1500 mg of sodium daily.  · Limit refined or processed carbohydrates. Your diet should include mostly whole grains. Desserts and added sugars should be used sparingly.  · Include small amounts of heart-healthy fats. These types of fats include nuts, oils, and tub margarine. Limit saturated and trans fats. These fats have been shown to be harmful in the body.  CHOOSING FOODS   The following food groups are based on a 2000 calorie diet. See your Registered Dietitian for individual calorie needs.  Grains and Grain Products (6 to 8 servings daily)  · Eat More Often: Whole-wheat bread, brown rice, whole-grain or wheat pasta, quinoa, popcorn without added fat or salt (air popped).  · Eat Less Often: White bread, white pasta, white rice, cornbread.  Vegetables (4 to 5 servings daily)  · Eat More Often: Fresh, frozen, and canned vegetables. Vegetables may be raw, steamed, roasted, or grilled with a minimal amount of fat.  · Eat Less Often/Avoid: Creamed or fried vegetables. Vegetables in a cheese sauce.  Fruit (4 to 5 servings daily)  · Eat More Often: All fresh, canned (in natural juice), or frozen fruits. Dried fruits without added sugar. One hundred percent fruit juice (½ cup [237 mL] daily).  · Eat Less Often: Dried fruits with added sugar. Canned fruit in light or heavy syrup.  Lean Meats, Fish, and Poultry (2  servings or less daily. One serving is 3 to 4 oz [85-114 g]).  · Eat More Often: Ninety percent or leaner ground beef, tenderloin, sirloin. Round cuts of beef, chicken breast, turkey breast. All fish. Grill, bake, or broil your meat. Nothing should be fried.  · Eat Less Often/Avoid: Fatty cuts of meat, turkey, or chicken leg, thigh, or wing. Fried cuts of meat or fish.  Dairy (2 to 3 servings)  · Eat More Often: Low-fat or fat-free milk, low-fat plain or light yogurt, reduced-fat or part-skim cheese.  · Eat Less Often/Avoid: Milk (whole, 2%). Whole milk yogurt. Full-fat cheeses.  Nuts, Seeds, and Legumes (4 to 5 servings per week)  · Eat More Often: All without added salt.  · Eat Less Often/Avoid: Salted nuts and seeds, canned beans with added salt.  Fats and Sweets (limited)  · Eat More Often: Vegetable oils, tub margarines without trans fats, sugar-free gelatin. Mayonnaise and salad dressings.  · Eat Less Often/Avoid: Coconut oils, palm oils, butter, stick margarine, cream, half and half, cookies, candy, pie.  FOR MORE INFORMATION  The Dash Diet Eating Plan: www.dashdiet.org  Document Released: 07/20/2011 Document Revised: 10/23/2011 Document Reviewed: 07/20/2011  [employer]® Patient Information ©2014 [employer].

## 2013-12-08 NOTE — Progress Notes (Signed)
Patient ID: [name] [name], male   DOB: 11/13/1960, 53 y.o.   MRN: [id number] SUBJECTIVE: CC: Chief Complaint  Patient presents with  . Follow-up    follow up BP    HPI:  Patient is here for follow up of hypertension: denies Headache;deniesChest Pain;denies weakness;denies Shortness of Breath or Orthopnea;denies Visual changes;denies palpitations;denies cough;denies pedal edema;denies symptoms of TIA or stroke; admits to Compliance with medications. denies Problems with medications.  Past Medical History  Diagnosis Date  . Hep B w/o coma, chronic, w/o delta   . Tattoos   . Hypertension    No past surgical history on file. History   Social History  . Marital Status: Divorced    Spouse Name: N/A    Number of Children: N/A  . Years of Education: N/A   Occupational History  . Not on file.   Social History Main Topics  . Smoking status: Never Smoker   . Smokeless tobacco: Not on file  . Alcohol Use: No  . Drug Use: No  . Sexual Activity: Not on file   Other Topics Concern  . Not on file   Social History Narrative  . No narrative on file   Family History  Problem Relation Age of Onset  . Diabetes Mother   . Hypertension Mother   . Diabetes Father    Current Outpatient Prescriptions on File Prior to Visit  Medication Sig Dispense Refill  . amLODipine (NORVASC) 5 MG tablet Take 1.5 tablets (7.5 mg total) by mouth daily.  45 tablet  5  . entecavir (BARACLUDE) 0.5 MG tablet Take 0.5 mg by mouth daily.        Marland Kitchen tenofovir (VIREAD) 300 MG tablet Take 1 tablet (300 mg total) by mouth daily.  30 tablet  4   No current facility-administered medications on file prior to visit.   No Known Allergies Immunization History  Administered Date(s) Administered  . Tdap 03/15/2011   Prior to Admission medications   Medication Sig Start Date End Date Taking? Authorizing Provider  amLODipine (NORVASC) 5 MG tablet Take 1.5 tablets (7.5 mg total) by mouth daily. 11/07/13  Yes  Vernie Shanks, MD  entecavir (BARACLUDE) 0.5 MG tablet Take 0.5 mg by mouth daily.     Yes Historical Provider, MD  tenofovir (VIREAD) 300 MG tablet Take 1 tablet (300 mg total) by mouth daily. 10/12/11  Yes Ileene Patrick, MD     ROS: As above in the HPI. All other systems are stable or negative.  OBJECTIVE: APPEARANCE:  Patient in no acute distress.The patient appeared well nourished and normally developed. Acyanotic. Waist: VITAL SIGNS:BP 130/80  Pulse 63  Temp(Src) 97.6 F (36.4 C) (Oral)  Ht 5' 9"  (1.753 m)  Wt 200 lb (90.719 kg)  BMI 29.52 kg/m2  AAM muscular  SKIN: warm and  Dry without overt rashes and scars. tattoos  HEAD and Neck: without JVD, Head and scalp: normal Eyes:No scleral icterus. Fundi normal, eye movements normal. Ears: Auricle normal, canal normal, Tympanic membranes normal, insufflation normal. Nose: normal Throat: normal Neck & thyroid: normal  CHEST & LUNGS: Chest wall: normal Lungs: Clear  CVS: Reveals the PMI to be normally located. Regular rhythm, First and Second Heart sounds are normal,  absence of murmurs, rubs or gallops. Peripheral vasculature: Radial pulses: normal Dorsal pedis pulses: normal Posterior pulses: normal  ABDOMEN:  Appearance: normal Benign, no organomegaly, no masses, no Abdominal Aortic enlargement. No Guarding , no rebound. No Bruits. Bowel sounds: normal  RECTAL:  N/A GU: N/A  EXTREMETIES: nonedematous.  MUSCULOSKELETAL:  Spine: normal Joints: intact  NEUROLOGIC: oriented to time,place and person; nonfocal. Strength is normal Sensory is normal Reflexes are normal Cranial Nerves are normal. Results for orders placed in visit on 11/07/13  CMP14+EGFR      Result Value Ref Range   Glucose 92  65 - 99 mg/dL   BUN 17  6 - 24 mg/dL   Creatinine, Ser 1.53 (*) 0.76 - 1.27 mg/dL   GFR calc non Af Amer 51 (*) >59 mL/min/1.73   GFR calc Af Amer 59 (*) >59 mL/min/1.73   BUN/Creatinine Ratio 11  9 - 20    Sodium 141  134 - 144 mmol/L   Potassium 4.4  3.5 - 5.2 mmol/L   Chloride 101  97 - 108 mmol/L   CO2 25  18 - 29 mmol/L   Calcium 9.2  8.7 - 10.2 mg/dL   Total Protein 7.2  6.0 - 8.5 g/dL   Albumin 4.6  3.5 - 5.5 g/dL   Globulin, Total 2.6  1.5 - 4.5 g/dL   Albumin/Globulin Ratio 1.8  1.1 - 2.5   Total Bilirubin 0.3  0.0 - 1.2 mg/dL   Alkaline Phosphatase 85  39 - 117 IU/L   AST 54 (*) 0 - 40 IU/L   ALT 53 (*) 0 - 44 IU/L  NMR, LIPOPROFILE      Result Value Ref Range   LDL Particle Number 780  <1000 nmol/L   LDLC SERPL CALC-MCNC 66  <100 mg/dL   HDL Cholesterol by NMR 41  >=40 mg/dL   Triglycerides by NMR 66  <150 mg/dL   Cholesterol 120  <200 mg/dL   HDL Particle Number 26.5 (*) >=30.5 umol/L   Small LDL Particle Number 113  <=527 nmol/L   LDL Size 21.4  >20.5 nm   LP-IR Score 26  <=45    ASSESSMENT: Hypertension  Hepatitis B infection  Elevated blood pressure (not hypertension)  CKD (chronic kidney disease) stage 3, GFR 30-59 ml/min  Tattoos  Tinea unguium  Dyslipidemia BP better.  PLAN: Dash diet Same medication regimen.  cardio exercise as well as the weight resistance body building.  No orders of the defined types were placed in this encounter.   No orders of the defined types were placed in this encounter.   There are no discontinued medications. Return in about 3 months (around 03/09/2014) for recheck BP.  Joette Schmoker P. Jacelyn Grip, M.D.

## 2013-12-09 ENCOUNTER — Telehealth: Payer: Self-pay | Admitting: Family Medicine

## 2013-12-10 NOTE — Telephone Encounter (Signed)
Patient did not have labs drawn. The real reason for his call was to find out the address of where Dr. Jacelyn Grip is moving. Name and city of practice provided.

## 2014-01-20 ENCOUNTER — Other Ambulatory Visit: Payer: Self-pay | Admitting: Nurse Practitioner

## 2014-01-20 DIAGNOSIS — C220 Liver cell carcinoma: Secondary | ICD-10-CM

## 2014-01-29 ENCOUNTER — Ambulatory Visit
Admission: RE | Admit: 2014-01-29 | Discharge: 2014-01-29 | Disposition: A | Discharge status: Home / Self Care | Payer: Managed Care, Other (non HMO) | Source: Ambulatory Visit | Attending: Nurse Practitioner | Admitting: Nurse Practitioner

## 2014-01-29 DIAGNOSIS — C220 Liver cell carcinoma: Secondary | ICD-10-CM

## 2014-03-23 IMAGING — CR DG CERVICAL SPINE COMPLETE 4+V
6 series · 6 of 6 positions shown · non-contrast
Comparison: None

CLINICAL DATA: Neck and left are radicular pain, numbness

CERVICAL SPINE - COMPLETE 4+ VIEW

[view not recorded (1 of 6)]
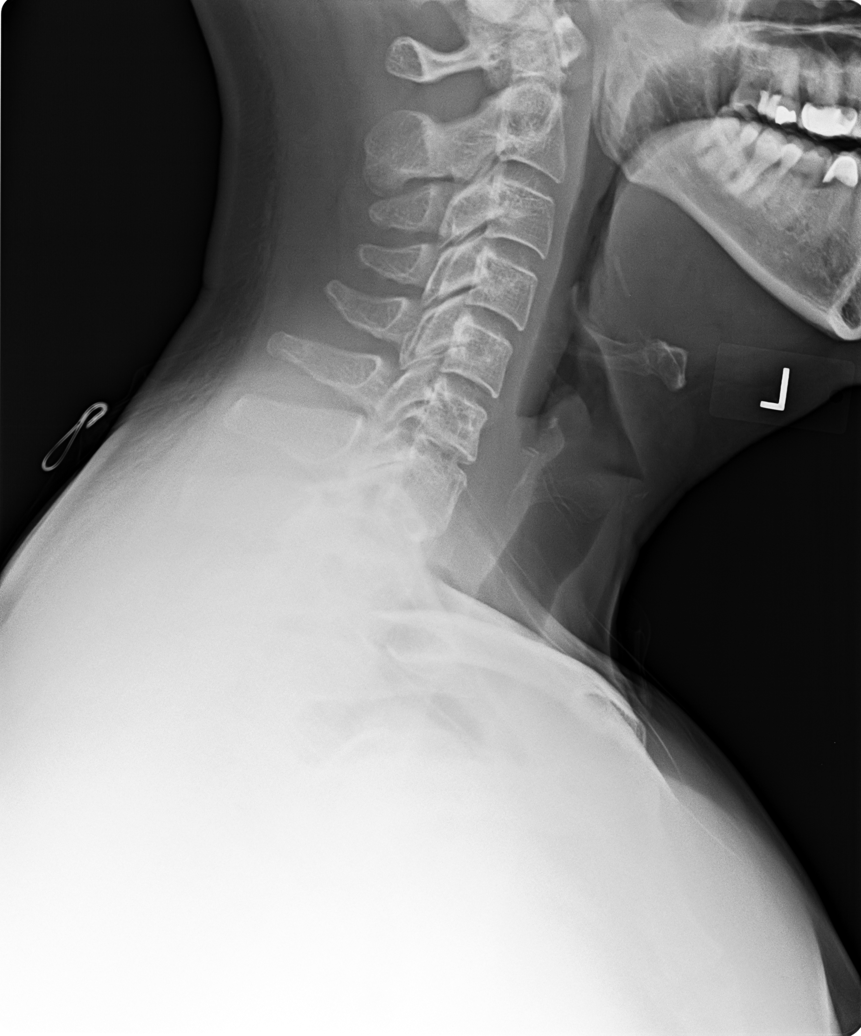

[view not recorded (2 of 6)]
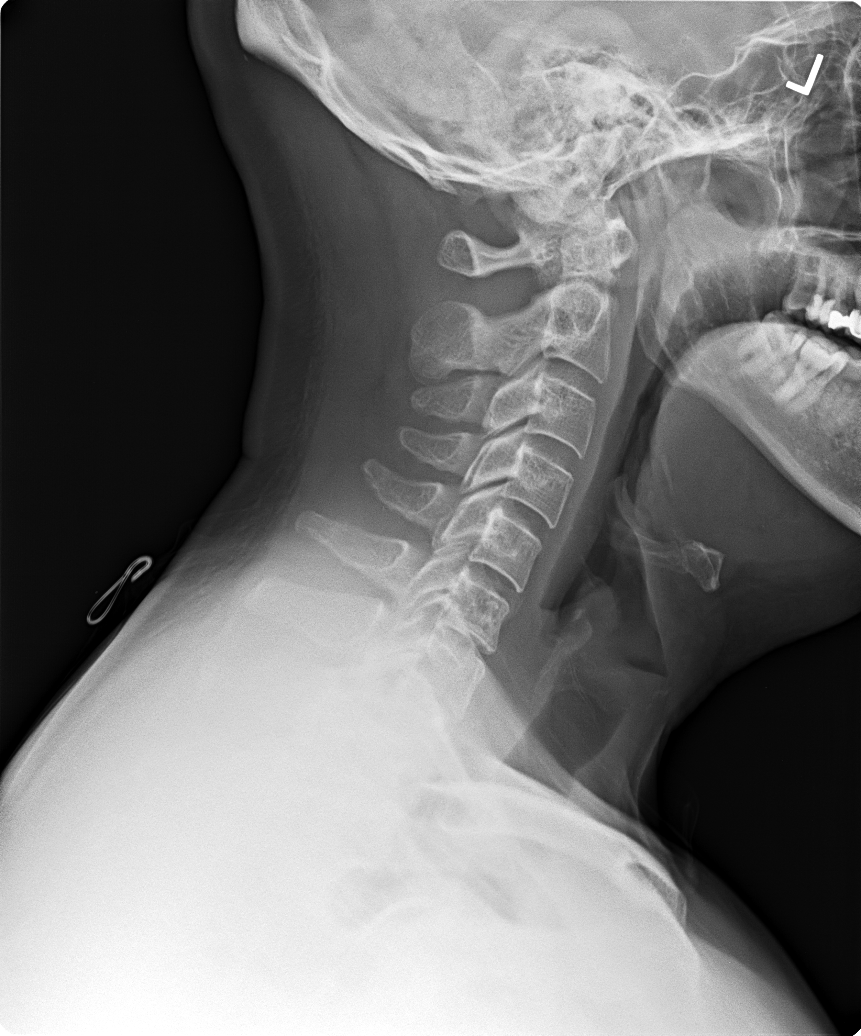

[view not recorded (3 of 6)]
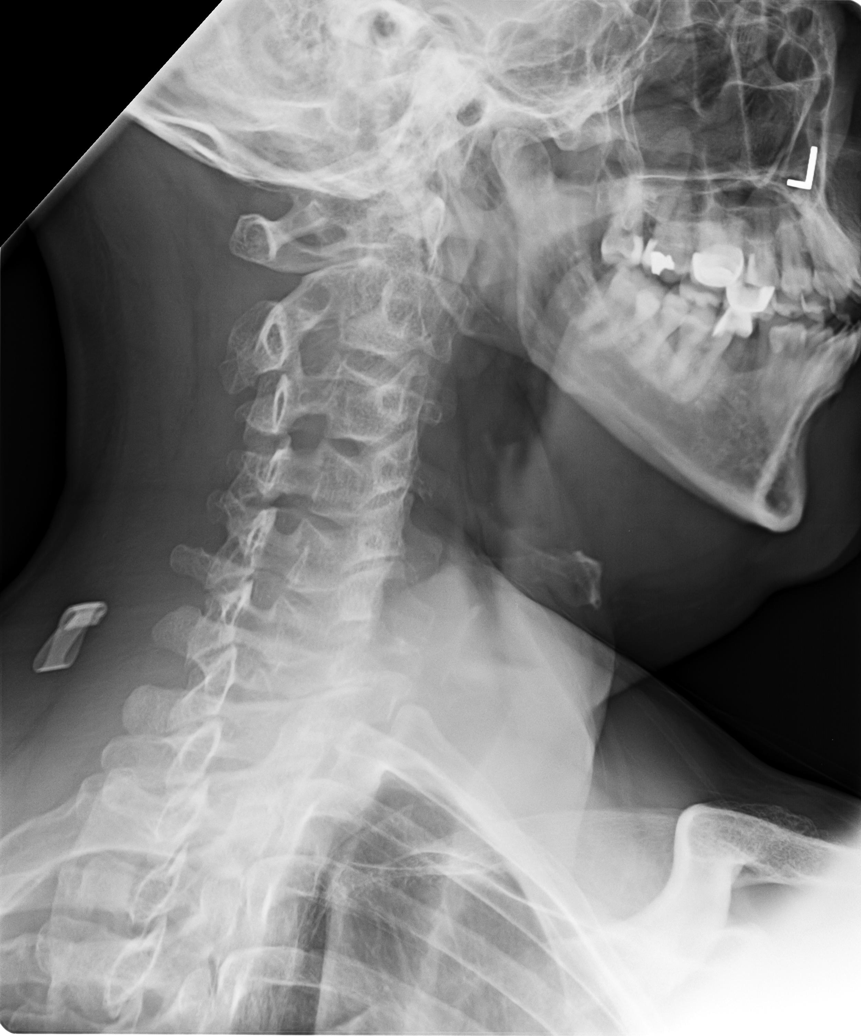

[view not recorded (4 of 6)]
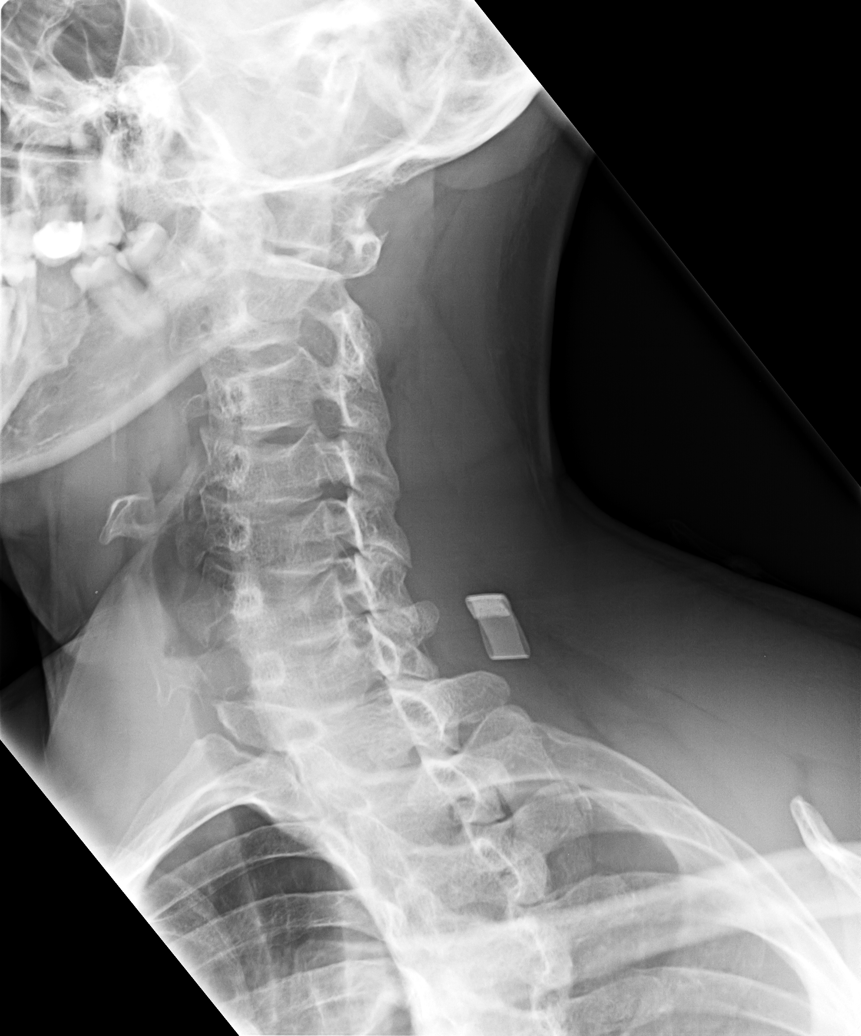

[view not recorded (5 of 6)]
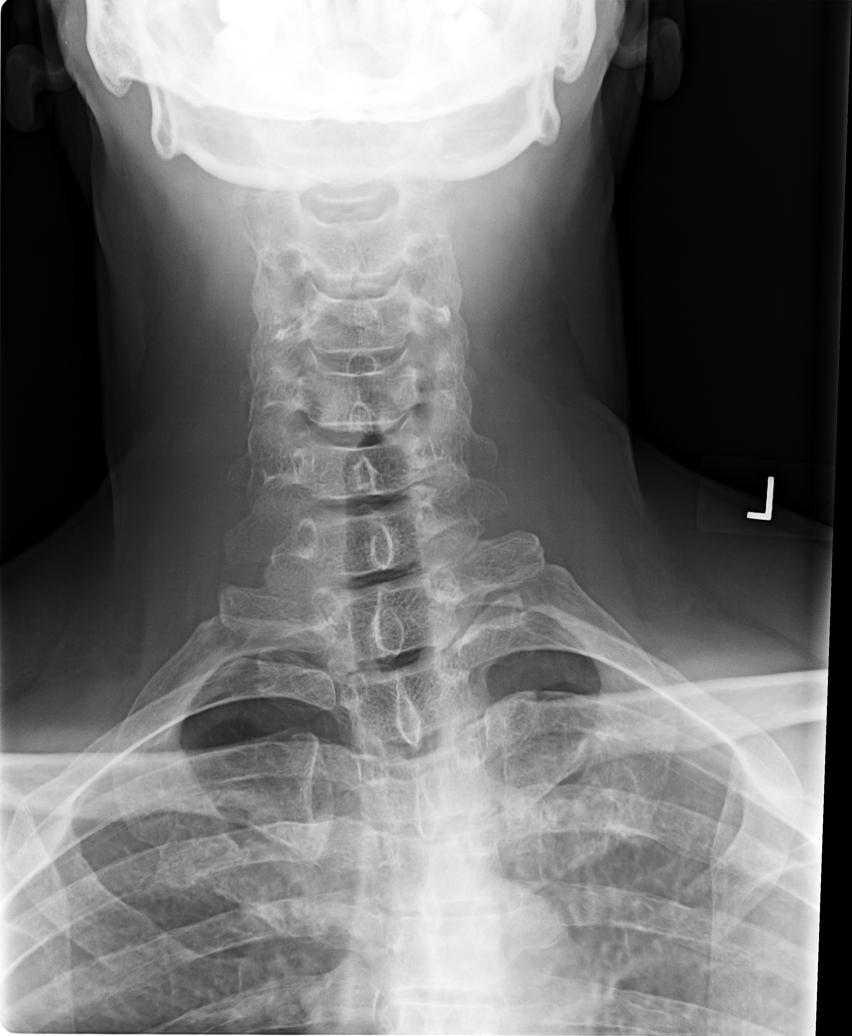

[view not recorded (6 of 6)]
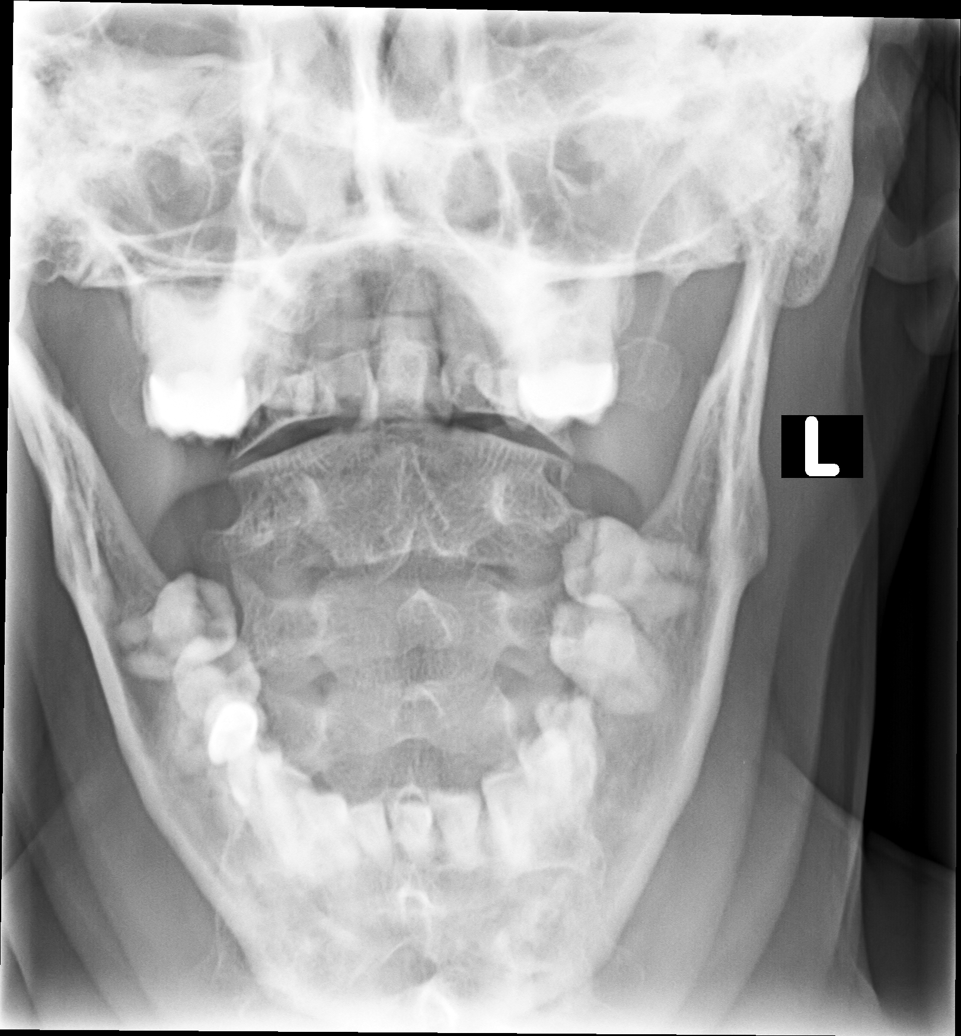

[6 of 6 positions shown; findings below may reference images not displayed]

FINDINGS: Prevertebral soft tissues normal thickness.
Vertebral body and disc space heights maintained.
No acute fracture, subluxation or bone destruction.
Foramina patent.
Lung apices clear.
Minimal cervicothoracic scoliosis.
C1-C2 alignment grossly normal.
IMPRESSION: Minimal scoliosis.
No acute abnormalities.

## 2014-05-13 ENCOUNTER — Encounter: Payer: Commercial Indemnity | Admitting: Family Medicine

## 2014-05-17 ENCOUNTER — Other Ambulatory Visit: Payer: Self-pay | Admitting: Family Medicine

## 2014-05-21 ENCOUNTER — Ambulatory Visit (INDEPENDENT_AMBULATORY_CARE_PROVIDER_SITE_OTHER): Payer: Commercial Indemnity | Admitting: Family Medicine

## 2014-05-21 ENCOUNTER — Encounter: Payer: Self-pay | Admitting: Family Medicine

## 2014-05-21 VITALS — BP 144/72 | HR 71 | Temp 98.2°F | Ht 69.0 in | Wt 209.0 lb

## 2014-05-21 DIAGNOSIS — E785 Hyperlipidemia, unspecified: Secondary | ICD-10-CM

## 2014-05-21 DIAGNOSIS — N183 Chronic kidney disease, stage 3 unspecified: Secondary | ICD-10-CM

## 2014-05-21 DIAGNOSIS — Z0000 Encounter for general adult medical examination without abnormal findings: Secondary | ICD-10-CM

## 2014-05-21 DIAGNOSIS — I10 Essential (primary) hypertension: Secondary | ICD-10-CM

## 2014-05-21 LAB — POCT URINALYSIS DIPSTICK
Bilirubin, UA: NEGATIVE
Blood, UA: NEGATIVE
Glucose, UA: NEGATIVE
Ketones, UA: NEGATIVE
LEUKOCYTES UA: NEGATIVE
NITRITE UA: NEGATIVE
PH UA: 6
PROTEIN UA: NEGATIVE
Spec Grav, UA: 1.015
UROBILINOGEN UA: NEGATIVE

## 2014-05-21 NOTE — Progress Notes (Signed)
   Subjective:    Patient ID: [name] [name], male    DOB: October 31, 1960, 53 y.o.   MRN: [id number]  HPI 53 year old gentleman here to followup hypertension, hepatitis B for which he sees an hepatologist in [place], and chronic kidney disease. He was told last year of his chronic kidney disease. There is no known etiology. He mentioned this problem to his hepatologist and there did not seem to be in association per the specialist He monitors his blood pressure some at home and umbers have been satisfactory In addition he complains of some back pain today that seems to be aggravated by movement. He has concerns that the back pain could be related to his kidney disease threre is a connection with movement I think it is not likely to be related to his kidneys    Review of Systems  Musculoskeletal: Positive for back pain.  All other systems reviewed and are negative.      Objective:   Physical Exam  Constitutional: He is oriented to person, place, and time. He appears well-developed and well-nourished.  HENT:  Head: Normocephalic.  Right Ear: External ear normal.  Left Ear: External ear normal.  Nose: Nose normal.  Mouth/Throat: Oropharynx is clear and moist.  Eyes: Conjunctivae and EOM are normal. Pupils are equal, round, and reactive to light.  Neck: Normal range of motion. Neck supple.  Cardiovascular: Normal rate, regular rhythm, normal heart sounds and intact distal pulses.   Pulmonary/Chest: Effort normal and breath sounds normal.  Abdominal: Soft. Bowel sounds are normal.  Genitourinary: Rectum normal and prostate normal.  Musculoskeletal: Normal range of motion.  Neurological: He is alert and oriented to person, place, and time.  Skin: Skin is warm and dry.  Psychiatric: He has a normal mood and affect. His behavior is normal. Judgment and thought content normal.   BP 144/72  Pulse 71  Temp(Src) 98.2 F (36.8 C) (Oral)  Ht _0  (1.753 m)  Wt 209 lb (94.802 kg)  BMI  30.85 kg/m2      Assessment & Plan:  1. Essential hypertension Controlled on amlodipine 7.5 md; no edema - CMP14+EGFR  2. Dyslipidemia  - Lipid panel  3. CKD (chronic kidney disease) stage 3, GFR 30-59 ml/min If renal function has declined, needs nephrology - POCT urinalysis dipstick  4. Routine general medical examination at a health care facility  - PSA, total and   Wardell Honour MD

## 2014-05-22 LAB — CMP14+EGFR
ALT: 58 IU/L — AB (ref 0–44)
AST: 55 IU/L — AB (ref 0–40)
Albumin/Globulin Ratio: 1.7 (ref 1.1–2.5)
Albumin: 4.5 g/dL (ref 3.5–5.5)
Alkaline Phosphatase: 79 IU/L (ref 39–117)
BUN/Creatinine Ratio: 10 (ref 9–20)
BUN: 18 mg/dL (ref 6–24)
CHLORIDE: 99 mmol/L (ref 97–108)
CO2: 25 mmol/L (ref 18–29)
Calcium: 9.6 mg/dL (ref 8.7–10.2)
Creatinine, Ser: 1.8 mg/dL — ABNORMAL HIGH (ref 0.76–1.27)
GFR calc Af Amer: 48 mL/min/{1.73_m2} — ABNORMAL LOW (ref >59)
GFR calc non Af Amer: 42 mL/min/{1.73_m2} — ABNORMAL LOW (ref >59)
Globulin, Total: 2.7 g/dL (ref 1.5–4.5)
Glucose: 113 mg/dL — ABNORMAL HIGH (ref 65–99)
Potassium: 4.6 mmol/L (ref 3.5–5.2)
SODIUM: 141 mmol/L (ref 134–144)
Total Bilirubin: 0.4 mg/dL (ref 0.0–1.2)
Total Protein: 7.2 g/dL (ref 6.0–8.5)

## 2014-05-22 LAB — LIPID PANEL
CHOL/HDL RATIO: 2.6 ratio (ref 0.0–5.0)
Cholesterol, Total: 118 mg/dL (ref 100–199)
HDL: 46 mg/dL (ref >39)
LDL CALC: 64 mg/dL (ref 0–99)
Triglycerides: 41 mg/dL (ref 0–149)
VLDL CHOLESTEROL CAL: 8 mg/dL (ref 5–40)

## 2014-05-22 LAB — PSA, TOTAL AND FREE
PSA, Free Pct: 45 %
PSA, Free: 0.27 ng/mL
PSA: 0.6 ng/mL (ref 0.0–4.0)

## 2014-05-26 ENCOUNTER — Telehealth: Payer: Self-pay | Admitting: Family Medicine

## 2014-05-27 ENCOUNTER — Other Ambulatory Visit: Payer: Self-pay | Admitting: Family Medicine

## 2014-05-27 DIAGNOSIS — N289 Disorder of kidney and ureter, unspecified: Secondary | ICD-10-CM

## 2014-05-27 NOTE — Telephone Encounter (Signed)
Patient aware.

## 2014-06-15 ENCOUNTER — Other Ambulatory Visit: Payer: Self-pay | Admitting: Nurse Practitioner

## 2014-08-03 ENCOUNTER — Other Ambulatory Visit: Payer: Self-pay | Admitting: Nurse Practitioner

## 2014-08-03 DIAGNOSIS — C220 Liver cell carcinoma: Secondary | ICD-10-CM

## 2014-08-11 ENCOUNTER — Ambulatory Visit
Admission: RE | Admit: 2014-08-11 | Discharge: 2014-08-11 | Disposition: A | Discharge status: Home / Self Care | Payer: Managed Care, Other (non HMO) | Source: Ambulatory Visit | Attending: Nurse Practitioner | Admitting: Nurse Practitioner

## 2014-08-11 DIAGNOSIS — C220 Liver cell carcinoma: Secondary | ICD-10-CM

## 2014-08-24 ENCOUNTER — Encounter: Payer: Self-pay | Admitting: Gastroenterology

## 2014-12-02 ENCOUNTER — Other Ambulatory Visit: Payer: Self-pay

## 2014-12-02 MED ORDER — AMLODIPINE BESYLATE 5 MG PO TABS: ORAL_TABLET | ORAL | Status: DC

## 2014-12-30 ENCOUNTER — Other Ambulatory Visit: Payer: Self-pay | Admitting: *Deleted

## 2014-12-30 MED ORDER — AMLODIPINE BESYLATE 5 MG PO TABS: ORAL_TABLET | ORAL | Status: DC

## 2015-01-26 ENCOUNTER — Other Ambulatory Visit: Payer: Self-pay | Admitting: Physician Assistant

## 2015-01-26 DIAGNOSIS — B180 Chronic viral hepatitis B with delta-agent: Secondary | ICD-10-CM

## 2015-01-27 ENCOUNTER — Other Ambulatory Visit: Payer: Self-pay | Admitting: *Deleted

## 2015-01-27 NOTE — Telephone Encounter (Signed)
Patient notified at last refill that NTBS. Made an appt for 10-16. Please advise on refills till then

## 2015-01-28 MED ORDER — AMLODIPINE BESYLATE 5 MG PO TABS: ORAL_TABLET | ORAL | Status: DC

## 2015-02-18 ENCOUNTER — Ambulatory Visit
Admission: RE | Admit: 2015-02-18 | Discharge: 2015-02-18 | Disposition: A | Discharge status: Home / Self Care | Payer: Managed Care, Other (non HMO) | Source: Ambulatory Visit | Attending: Physician Assistant | Admitting: Physician Assistant

## 2015-02-18 DIAGNOSIS — B180 Chronic viral hepatitis B with delta-agent: Secondary | ICD-10-CM

## 2015-05-04 ENCOUNTER — Encounter: Payer: Commercial Indemnity | Admitting: Family Medicine

## 2015-05-24 ENCOUNTER — Encounter: Payer: Self-pay | Admitting: Family Medicine

## 2015-05-24 ENCOUNTER — Ambulatory Visit (INDEPENDENT_AMBULATORY_CARE_PROVIDER_SITE_OTHER): Payer: Commercial Indemnity | Admitting: Family Medicine

## 2015-05-24 VITALS — BP 154/84 | HR 63 | Temp 96.6°F | Ht 69.0 in | Wt 214.0 lb

## 2015-05-24 DIAGNOSIS — N183 Chronic kidney disease, stage 3 unspecified: Secondary | ICD-10-CM

## 2015-05-24 DIAGNOSIS — Z0000 Encounter for general adult medical examination without abnormal findings: Secondary | ICD-10-CM

## 2015-05-24 DIAGNOSIS — B181 Chronic viral hepatitis B without delta-agent: Secondary | ICD-10-CM

## 2015-05-24 NOTE — Progress Notes (Signed)
   Subjective:    Patient ID: [name] [name], male    DOB: 03-30-1961, 54 y.o.   MRN: [id number]  HPI 54 year old gentleman here for an annual exam. Chronic medical problems include include chronic kidney disease and chronic hepatitis B and hypertension. He has been seen by nephrologist since his last visit with no changes made. I was really expecting him to be placed on ace. He is followed by GI for his hepatitis. Had ultrasound. His medicine was reduced at last visit. He monitors his blood pressure at home and pressures of been in the morning normal range even though it's elevated somewhat today  Patient Active Problem List   Diagnosis Date Noted  . Routine general medical examination at a health care facility 05/21/2014  . Dyslipidemia 11/07/2013  . CKD (chronic kidney disease) stage 3, GFR 30-59 ml/min 08/15/2013  . Hypertension   . Elevated blood pressure (not hypertension) 04/17/2013  . Annual physical exam 04/17/2013  . Tinea unguium 04/17/2013  . Hep B w/o coma, chronic, w/o delta (HCC)   . Tattoos   . Hepatitis B infection 10/31/2012   Outpatient Encounter Prescriptions as of 05/24/2015  Medication Sig  . amLODipine (NORVASC) 5 MG tablet TAKE 1 AND 1/2 TABLET DAILY  . tenofovir (VIREAD) 300 MG tablet Take 1 tablet (300 mg total) by mouth daily. (Patient taking differently: Take 300 mg by mouth every other day. )   No facility-administered encounter medications on file as of 05/24/2015.      Review of Systems  Constitutional: Negative.   HENT: Negative.   Eyes: Negative.   Respiratory: Negative.  Negative for shortness of breath.   Cardiovascular: Negative.  Negative for chest pain and leg swelling.  Gastrointestinal: Negative.   Genitourinary: Negative.   Musculoskeletal: Negative.   Skin: Negative.   Neurological: Negative.   Psychiatric/Behavioral: Negative.   All other systems reviewed and are negative.      Objective:   Physical Exam  Constitutional: He is  oriented to person, place, and time. He appears well-developed and well-nourished.  Cardiovascular: Normal rate and regular rhythm.   Pulmonary/Chest: Effort normal and breath sounds normal.  Abdominal: Soft. Bowel sounds are normal.  Neurological: He is alert and oriented to person, place, and time.  Psychiatric: He has a normal mood and affect. Thought content normal.          Assessment & Plan:  1. Chronic viral hepatitis B without delta agent and without coma ([place]) Problem is followed by hepatologist and he apparently is doing well - Lipid panel - CMP14+EGFR  2. Annual physical exam Exam is normal. He has gained some weight since last visit but he is very muscular and works out on a [employer] gain is muscle rather than fat - PSA, total and free  3. CKD (chronic kidney disease) stage 3, GFR 30-59 ml/min Name in consultation by nephrology and did not start an Ace or arb on the left think this might be appropriate - Lipid panel  Wardell Honour MD - [phone]

## 2015-05-25 LAB — PSA, TOTAL AND FREE
PSA FREE PCT: 42.9 %
PSA, Free: 0.3 ng/mL
Prostate Specific Ag, Serum: 0.7 ng/mL (ref 0.0–4.0)

## 2015-05-25 LAB — CMP14+EGFR
ALT: 38 IU/L (ref 0–44)
AST: 28 IU/L (ref 0–40)
Albumin/Globulin Ratio: 1.5 (ref 1.1–2.5)
Albumin: 4.7 g/dL (ref 3.5–5.5)
Alkaline Phosphatase: 71 IU/L (ref 39–117)
BUN/Creatinine Ratio: 11 (ref 9–20)
BUN: 16 mg/dL (ref 6–24)
Bilirubin Total: 0.3 mg/dL (ref 0.0–1.2)
CALCIUM: 9.8 mg/dL (ref 8.7–10.2)
CO2: 25 mmol/L (ref 18–29)
Chloride: 101 mmol/L (ref 97–108)
Creatinine, Ser: 1.49 mg/dL — ABNORMAL HIGH (ref 0.76–1.27)
GFR, EST AFRICAN AMERICAN: 60 mL/min/{1.73_m2} (ref >59)
GFR, EST NON AFRICAN AMERICAN: 52 mL/min/{1.73_m2} — AB (ref >59)
GLUCOSE: 110 mg/dL — AB (ref 65–99)
Globulin, Total: 3.1 g/dL (ref 1.5–4.5)
Potassium: 4.7 mmol/L (ref 3.5–5.2)
Sodium: 141 mmol/L (ref 134–144)
TOTAL PROTEIN: 7.8 g/dL (ref 6.0–8.5)

## 2015-05-25 LAB — LIPID PANEL
CHOL/HDL RATIO: 3.6 ratio (ref 0.0–5.0)
Cholesterol, Total: 168 mg/dL (ref 100–199)
HDL: 47 mg/dL (ref >39)
LDL Calculated: 110 mg/dL — ABNORMAL HIGH (ref 0–99)
Triglycerides: 55 mg/dL (ref 0–149)
VLDL Cholesterol Cal: 11 mg/dL (ref 5–40)

## 2015-05-26 NOTE — Progress Notes (Signed)
Patient aware.

## 2015-06-03 ENCOUNTER — Telehealth: Payer: Self-pay | Admitting: Family Medicine

## 2015-06-10 ENCOUNTER — Ambulatory Visit (INDEPENDENT_AMBULATORY_CARE_PROVIDER_SITE_OTHER): Payer: Commercial Indemnity | Admitting: *Deleted

## 2015-06-10 VITALS — BP 125/67 | HR 74

## 2015-06-10 DIAGNOSIS — I10 Essential (primary) hypertension: Secondary | ICD-10-CM

## 2015-07-27 ENCOUNTER — Other Ambulatory Visit: Payer: Self-pay | Admitting: Nurse Practitioner

## 2015-07-27 ENCOUNTER — Other Ambulatory Visit: Payer: Self-pay | Admitting: *Deleted

## 2015-07-27 MED ORDER — AMLODIPINE BESYLATE 5 MG PO TABS: ORAL_TABLET | ORAL | Status: DC

## 2015-07-28 ENCOUNTER — Other Ambulatory Visit: Payer: Self-pay | Admitting: Nurse Practitioner

## 2015-07-28 DIAGNOSIS — C220 Liver cell carcinoma: Secondary | ICD-10-CM

## 2015-08-03 ENCOUNTER — Ambulatory Visit
Admission: RE | Admit: 2015-08-03 | Discharge: 2015-08-03 | Disposition: A | Discharge status: Home / Self Care | Payer: Managed Care, Other (non HMO) | Source: Ambulatory Visit | Attending: Nurse Practitioner | Admitting: Nurse Practitioner

## 2015-08-03 DIAGNOSIS — C220 Liver cell carcinoma: Secondary | ICD-10-CM

## 2015-08-19 ENCOUNTER — Ambulatory Visit (INDEPENDENT_AMBULATORY_CARE_PROVIDER_SITE_OTHER): Payer: Commercial Indemnity | Admitting: Family Medicine

## 2015-08-19 ENCOUNTER — Encounter: Payer: Self-pay | Admitting: Family Medicine

## 2015-08-19 VITALS — BP 134/71 | HR 70 | Temp 96.8°F | Ht 69.0 in | Wt 220.8 lb

## 2015-08-19 DIAGNOSIS — M542 Cervicalgia: Secondary | ICD-10-CM

## 2015-08-19 MED ORDER — PREDNISONE 20 MG PO TABS: 40.0000 mg | ORAL_TABLET | Freq: Every day | ORAL | Status: DC

## 2015-08-19 MED ORDER — CYCLOBENZAPRINE HCL 5 MG PO TABS: 5.0000 mg | ORAL_TABLET | Freq: Three times a day (TID) | ORAL | Status: DC | PRN

## 2015-08-19 NOTE — Progress Notes (Signed)
   HPI  Patient here complaining of neck pain.  He describes 2 weeks of intermittent sharp left-sided back pain that radiates to his left shoulder and left hand. He works out regularly and has no problem working out, he has no pain while working out. He has no loss of strength or sensation. He had one episode of this a few years ago which resolved with prednisone. He's been using Tylenol and ibuprofen carefully, as he has hepatitis B.  He denies fever, chills, sweats.   PMH: Smoking status noted ROS: Per HPI  Objective: BP 134/71 mmHg  Pulse 70  Temp(Src) 96.8 F (36 C) (Oral)  Ht 5\' 9"  (1.753 m)  Wt 220 lb 12.8 oz (100.154 kg)  BMI 32.59 kg/m2 Gen: NAD, alert, cooperative with exam HEENT: NCAT Ext: No edema, warm Neuro: Alert and oriented, No gross deficits  Assessment and plan:  #  neck pain Consistent with radicular neck pain, no loss of strength or sensation. Caution with Tylenol with hepatitis B, last liver labs were normal. Prednisone burst, 40 mg 7 days Flexeril if needed Follow-up if not improved or worsening- discussed possible next steps including x-rays and physical therapy     Meds ordered this encounter  Medications  . predniSONE (DELTASONE) 20 MG tablet    Sig: Take 2 tablets (40 mg total) by mouth daily with breakfast.    Dispense:  14 tablet    Refill:  0  . cyclobenzaprine (FLEXERIL) 5 MG tablet    Sig: Take 1 tablet (5 mg total) by mouth 3 (three) times daily as needed for muscle spasms.    Dispense:  20 tablet    Refill:  0    Laroy Apple, MD [facility] 08/19/2015, 8:16 AM

## 2015-08-19 NOTE — Patient Instructions (Signed)
Great to meet you!  Keep moving! Try the prednisone 2 pills twice daily, Flexeril at night if needed- it will likely make you sleepy.   Come back if you do not get better or worsen.

## 2015-08-27 ENCOUNTER — Ambulatory Visit (INDEPENDENT_AMBULATORY_CARE_PROVIDER_SITE_OTHER): Payer: Managed Care, Other (non HMO) | Admitting: Family Medicine

## 2015-08-27 ENCOUNTER — Encounter: Payer: Self-pay | Admitting: Family Medicine

## 2015-08-27 VITALS — BP 139/79 | HR 77 | Temp 97.4°F | Ht 69.0 in | Wt 221.4 lb

## 2015-08-27 DIAGNOSIS — M542 Cervicalgia: Secondary | ICD-10-CM | POA: Diagnosis not present

## 2015-08-27 MED ORDER — TRAMADOL HCL 50 MG PO TABS: 50.0000 mg | ORAL_TABLET | Freq: Three times a day (TID) | ORAL | Status: DC | PRN

## 2015-08-27 NOTE — Progress Notes (Signed)
   HPI  Patient presents today here for continued neck pain  He describes 3 weeks Of intermittent left-sided neck pain that radiates down to his left forearm. He has numbness and tingling type pain He has no weakness or loss of function. Has a history once or twice previously, once treated with prednisone successfully.  We tried a prednisone course last week which did not help his pain. Flexeril did not really help it did help him sleep.  He's feeling well, denies fever, chills, sweats.  He also denies any traumatic event that began this pain.  PMH: Smoking status noted ROS: Per HPI  Objective: BP 139/79 mmHg  Pulse 77  Temp(Src) 97.4 F (36.3 C) (Oral)  Ht 5\' 9"  (1.753 m)  Wt 221 lb 6.4 oz (100.426 kg)  BMI 32.68 kg/m2 Gen: NAD, alert, cooperative with exam HEENT: NCAT CV: RRR, good S1/S2, no murmur Resp: CTABL, no wheezes, non-labored Ext: No edema, warm Neuro: Alert and oriented, drink 5/5 and sensation intact in bilateral upper extremities MSK: no tenderness to palpation of midline cervical spine, paraspinal muscles, shoulder, or arm  Assessment and plan:  # Neck pain Continued after prednisone course Considering that he has hepatitis B and CKD stage III I am avoiding Tylenol as well as NSAIDs. Tramadol, flexeril Red flags reviewed and reasons to seek emergency care   Meds ordered this encounter  Medications  . traMADol (ULTRAM) 50 MG tablet    Sig: Take 1 tablet (50 mg total) by mouth every 8 (eight) hours as needed.    Dispense:  60 tablet    Refill:  0    Laroy Apple, MD [facility] 08/27/2015, 3:50 PM

## 2015-08-27 NOTE — Patient Instructions (Signed)
Great to see you!  Use heat, gentle stretching and flexeril as needed.  Tramadol is a pain medication that works on opiate receptors, do not drive after taking it.   Please come back if it has not begun to improve within 3 weeks

## 2015-12-23 ENCOUNTER — Encounter: Payer: Self-pay | Admitting: Gastroenterology

## 2016-01-20 ENCOUNTER — Other Ambulatory Visit: Payer: Self-pay | Admitting: *Deleted

## 2016-01-20 MED ORDER — AMLODIPINE BESYLATE 5 MG PO TABS: ORAL_TABLET | ORAL | Status: DC

## 2016-01-31 ENCOUNTER — Other Ambulatory Visit: Payer: Self-pay | Admitting: Nurse Practitioner

## 2016-01-31 DIAGNOSIS — B181 Chronic viral hepatitis B without delta-agent: Secondary | ICD-10-CM

## 2016-02-16 ENCOUNTER — Other Ambulatory Visit: Payer: Managed Care, Other (non HMO)

## 2016-02-24 ENCOUNTER — Other Ambulatory Visit: Payer: Managed Care, Other (non HMO)

## 2016-03-20 ENCOUNTER — Ambulatory Visit
Admission: RE | Admit: 2016-03-20 | Discharge: 2016-03-20 | Disposition: A | Discharge status: Home / Self Care | Payer: Managed Care, Other (non HMO) | Source: Ambulatory Visit | Attending: Nurse Practitioner | Admitting: Nurse Practitioner

## 2016-03-20 DIAGNOSIS — B181 Chronic viral hepatitis B without delta-agent: Secondary | ICD-10-CM

## 2016-04-18 ENCOUNTER — Telehealth: Payer: Self-pay | Admitting: Physician Assistant

## 2016-04-18 MED ORDER — AMLODIPINE BESYLATE 5 MG PO TABS: ORAL_TABLET | ORAL | 1 refills | Status: DC

## 2016-04-18 NOTE — Telephone Encounter (Signed)
Prescription sent to pharmacy.

## 2016-05-09 ENCOUNTER — Encounter: Payer: Self-pay | Admitting: *Deleted

## 2016-05-23 ENCOUNTER — Ambulatory Visit (INDEPENDENT_AMBULATORY_CARE_PROVIDER_SITE_OTHER): Payer: Managed Care, Other (non HMO) | Admitting: Family Medicine

## 2016-05-23 ENCOUNTER — Encounter: Payer: Self-pay | Admitting: Family Medicine

## 2016-05-23 VITALS — BP 137/80 | HR 72 | Temp 98.3°F | Ht 69.0 in | Wt 220.0 lb

## 2016-05-23 DIAGNOSIS — I10 Essential (primary) hypertension: Secondary | ICD-10-CM

## 2016-05-23 DIAGNOSIS — Z23 Encounter for immunization: Secondary | ICD-10-CM | POA: Diagnosis not present

## 2016-05-23 DIAGNOSIS — Z0000 Encounter for general adult medical examination without abnormal findings: Secondary | ICD-10-CM

## 2016-05-23 DIAGNOSIS — N400 Enlarged prostate without lower urinary tract symptoms: Secondary | ICD-10-CM

## 2016-05-23 MED ORDER — AMLODIPINE BESYLATE 5 MG PO TABS: ORAL_TABLET | ORAL | 1 refills | Status: DC

## 2016-05-23 NOTE — Progress Notes (Signed)
Subjective:    Patient ID: [name] [name], male    DOB: 07/06/1961, 55 y.o.   MRN: [id number]  HPI 55-year-old gentleman here for annual exam. He takes 2 medicines, amlodipine for blood pressure and Viread for hepatitis. He denies any new symptoms or problems. He tries to watch diet. He works out 5 or 6 days a week.  Patient Active Problem List   Diagnosis Date Noted  . Routine general medical examination at a health care facility 05/21/2014  . Dyslipidemia 11/07/2013  . CKD (chronic kidney disease) stage 3, GFR 30-59 ml/min 08/15/2013  . Hypertension   . Elevated blood pressure (not hypertension) 04/17/2013  . Annual physical exam 04/17/2013  . Tinea unguium 04/17/2013  . Hep B w/o coma, chronic, w/o delta (HCC)   . Tattoos   . Hepatitis B infection 10/31/2012   Outpatient Encounter Prescriptions as of 05/23/2016  Medication Sig  . amLODipine (NORVASC) 5 MG tablet TAKE 1 AND 1/2 TABLET DAILY  . tenofovir (VIREAD) 300 MG tablet Take 1 tablet (300 mg total) by mouth daily. (Patient taking differently: Take 300 mg by mouth every other day. )  . [DISCONTINUED] amLODipine (NORVASC) 5 MG tablet TAKE 1 AND 1/2 TABLET DAILY  . [DISCONTINUED] cyclobenzaprine (FLEXERIL) 5 MG tablet Take 1 tablet (5 mg total) by mouth 3 (three) times daily as needed for muscle spasms.  . [DISCONTINUED] traMADol (ULTRAM) 50 MG tablet Take 1 tablet (50 mg total) by mouth every 8 (eight) hours as needed.   No facility-administered encounter medications on file as of 05/23/2016.       Review of Systems  Constitutional: Negative.   HENT: Negative.   Eyes: Negative.   Respiratory: Negative.  Negative for shortness of breath.   Cardiovascular: Negative.  Negative for chest pain and leg swelling.  Gastrointestinal: Negative.   Genitourinary: Negative.   Musculoskeletal: Negative.   Skin: Negative.   Neurological: Negative.   Psychiatric/Behavioral: Negative.   All other systems reviewed and are  negative.      Objective:   Physical Exam  Constitutional: He is oriented to person, place, and time. He appears well-developed and well-nourished.  HENT:  Head: Normocephalic.  Right Ear: External ear normal.  Left Ear: External ear normal.  Nose: Nose normal.  Mouth/Throat: Oropharynx is clear and moist.  Eyes: Conjunctivae and EOM are normal. Pupils are equal, round, and reactive to light.  Neck: Normal range of motion. Neck supple.  Cardiovascular: Normal rate, regular rhythm, normal heart sounds and intact distal pulses.   Pulmonary/Chest: Effort normal and breath sounds normal.  Abdominal: Soft. Bowel sounds are normal.  Musculoskeletal: Normal range of motion.  Neurological: He is alert and oriented to person, place, and time.  Skin: Skin is warm and dry.  Psychiatric: He has a normal mood and affect. His behavior is normal. Judgment and thought content normal.   BP 137/80   Pulse 72   Temp 98.3 F (36.8 C) (Oral)   Ht 5' 9" (1.753 m)   Wt 220 lb (99.8 kg)   BMI 32.49 kg/m         Assessment & Plan:  1. Essential hypertension Blood pressures are well controlled with amlodipine. He has no side effects. He is compliant with medicine - CMP14+EGFR - Lipid panel  2. BPH without obstruction/lower urinary tract symptoms Monitoring PSA and African-American male  Stephen M Miller MD  

## 2016-05-24 LAB — PSA, TOTAL AND FREE
PROSTATE SPECIFIC AG, SERUM: 0.6 ng/mL (ref 0.0–4.0)
PSA, Free Pct: 43.3 %
PSA, Free: 0.26 ng/mL

## 2016-05-24 LAB — CMP14+EGFR
ALBUMIN: 4.7 g/dL (ref 3.5–5.5)
ALT: 40 IU/L (ref 0–44)
AST: 71 IU/L — ABNORMAL HIGH (ref 0–40)
Albumin/Globulin Ratio: 1.7 (ref 1.2–2.2)
Alkaline Phosphatase: 75 IU/L (ref 39–117)
BUN/Creatinine Ratio: 9 (ref 9–20)
BUN: 13 mg/dL (ref 6–24)
Bilirubin Total: 0.5 mg/dL (ref 0.0–1.2)
CALCIUM: 9.6 mg/dL (ref 8.7–10.2)
CHLORIDE: 100 mmol/L (ref 96–106)
CO2: 26 mmol/L (ref 18–29)
CREATININE: 1.43 mg/dL — AB (ref 0.76–1.27)
GFR calc non Af Amer: 54 mL/min/{1.73_m2} — ABNORMAL LOW (ref >59)
GFR, EST AFRICAN AMERICAN: 63 mL/min/{1.73_m2} (ref >59)
Globulin, Total: 2.7 g/dL (ref 1.5–4.5)
Glucose: 113 mg/dL — ABNORMAL HIGH (ref 65–99)
Potassium: 4 mmol/L (ref 3.5–5.2)
Sodium: 141 mmol/L (ref 134–144)
TOTAL PROTEIN: 7.4 g/dL (ref 6.0–8.5)

## 2016-05-24 LAB — LIPID PANEL
CHOL/HDL RATIO: 3.6 ratio (ref 0.0–5.0)
Cholesterol, Total: 135 mg/dL (ref 100–199)
HDL: 38 mg/dL — ABNORMAL LOW (ref >39)
LDL CALC: 81 mg/dL (ref 0–99)
Triglycerides: 79 mg/dL (ref 0–149)
VLDL CHOLESTEROL CAL: 16 mg/dL (ref 5–40)

## 2016-05-25 ENCOUNTER — Telehealth: Payer: Self-pay | Admitting: Family Medicine

## 2016-05-25 NOTE — Telephone Encounter (Signed)
Patient contacted

## 2016-05-26 ENCOUNTER — Encounter: Payer: Managed Care, Other (non HMO) | Admitting: Family Medicine

## 2016-07-27 ENCOUNTER — Other Ambulatory Visit: Payer: Self-pay | Admitting: Nurse Practitioner

## 2016-07-27 DIAGNOSIS — B181 Chronic viral hepatitis B without delta-agent: Secondary | ICD-10-CM

## 2016-08-08 ENCOUNTER — Ambulatory Visit (INDEPENDENT_AMBULATORY_CARE_PROVIDER_SITE_OTHER): Payer: Managed Care, Other (non HMO) | Admitting: Physician Assistant

## 2016-08-08 ENCOUNTER — Encounter: Payer: Self-pay | Admitting: Physician Assistant

## 2016-08-08 VITALS — BP 138/76 | HR 91 | Temp 97.8°F | Ht 69.0 in | Wt 217.2 lb

## 2016-08-08 DIAGNOSIS — J209 Acute bronchitis, unspecified: Secondary | ICD-10-CM

## 2016-08-08 MED ORDER — PREDNISONE 10 MG (21) PO TBPK: ORAL_TABLET | ORAL | 0 refills | Status: DC

## 2016-08-08 MED ORDER — AZITHROMYCIN 250 MG PO TABS: ORAL_TABLET | ORAL | 0 refills | Status: DC

## 2016-08-08 NOTE — Patient Instructions (Signed)

## 2016-08-08 NOTE — Progress Notes (Signed)
BP 140/76   Pulse 90   Temp 97.8 F (36.6 C) (Oral)   Ht 5\' 9"  (1.753 m)   Wt 217 lb 3.2 oz (98.5 kg)   BMI 32.07 kg/m    Subjective:    Patient ID: [name] [name], male    DOB: 21-Jul-1961, 55 y.o.   MRN: [id number]  HPI: [name] [name] is a 55 y.o. male presenting on 08/08/2016 for Sore Throat (chest congestion, started last Sunday, worked last week, then really started feeling work on Friday); Fever (off & on); and Cough  Patient with several days of progressing aupprt respiratory and bronchial symptoms. Initially there was more upper respiratory congestion. This progressed to having significant cough that is productive throughout the day and severe at night. There is occasional wheezing after coughing. They will sometimes have slight dyspnea on exertion. It is productive mucus that is yellow in color at times. Denies any blood.  Relevant past medical, surgical, family and social history reviewed and updated as indicated. Allergies and medications reviewed and updated.  Past Medical History:  Diagnosis Date  . Hep B w/o coma, chronic, w/o delta (HCC)   . Hypertension   . Tattoos     History reviewed. No pertinent surgical history.  Review of Systems  Constitutional: Positive for fatigue and fever. Negative for appetite change.  HENT: Positive for sinus pressure and sore throat. Negative for congestion and trouble swallowing.   Eyes: Negative.  Negative for pain and visual disturbance.  Respiratory: Positive for cough and wheezing. Negative for chest tightness and shortness of breath.   Cardiovascular: Negative.  Negative for chest pain, palpitations and leg swelling.  Gastrointestinal: Negative.  Negative for abdominal pain, diarrhea, nausea and vomiting.  Endocrine: Negative.   Genitourinary: Negative.   Musculoskeletal: Positive for back pain and myalgias.  Skin: Negative.  Negative for color change and rash.  Neurological: Positive for headaches. Negative for  weakness and numbness.  Psychiatric/Behavioral: Negative.     Allergies as of 08/08/2016   No Known Allergies     Medication List       Accurate as of 08/08/16  8:37 AM. Always use your most recent med list.          amLODipine 5 MG tablet Commonly known as:  NORVASC TAKE 1 AND 1/2 TABLET DAILY   azithromycin 250 MG tablet Commonly known as:  ZITHROMAX Z-PAK As directed   predniSONE 10 MG (21) Tbpk tablet Commonly known as:  STERAPRED UNI-PAK 21 TAB As directed x 6 days   tenofovir 300 MG tablet Commonly known as:  VIREAD Take 1 tablet (300 mg total) by mouth daily.   VEMLIDY 25 MG Tabs Generic drug:  Tenofovir Alafenamide Fumarate Take 1 tablet by mouth daily.          Objective:    BP 140/76   Pulse 90   Temp 97.8 F (36.6 C) (Oral)   Ht 5\' 9"  (1.753 m)   Wt 217 lb 3.2 oz (98.5 kg)   BMI 32.07 kg/m   No Known Allergies  Physical Exam  Constitutional: He appears well-developed and well-nourished.  HENT:  Head: Normocephalic and atraumatic.  Right Ear: Hearing and tympanic membrane normal.  Left Ear: Hearing and tympanic membrane normal.  Nose: Mucosal edema and sinus tenderness present. No nasal deformity. Right sinus exhibits frontal sinus tenderness. Left sinus exhibits frontal sinus tenderness.  Mouth/Throat: Posterior oropharyngeal erythema present.  Eyes: Conjunctivae and EOM are normal. Pupils are equal, round, and reactive  to light. Right eye exhibits no discharge. Left eye exhibits no discharge.  Neck: Normal range of motion. Neck supple.  Cardiovascular: Normal rate, regular rhythm and normal heart sounds.   Pulmonary/Chest: Effort normal. No respiratory distress. He has no decreased breath sounds. He has wheezes. He has no rhonchi. He has no rales.  Abdominal: Soft. Bowel sounds are normal.  Musculoskeletal: Normal range of motion.  Skin: Skin is warm and dry.      Assessment & Plan:   1. Acute bronchitis, unspecified organism -  azithromycin (ZITHROMAX Z-PAK) 250 MG tablet; As directed  Dispense: 6 tablet; Refill: 0 - predniSONE (STERAPRED UNI-PAK 21 TAB) 10 MG (21) TBPK tablet; As directed x 6 days  Dispense: 21 tablet; Refill: 0   Continue all other maintenance medications as listed above.  Follow up plan: Follow-up as needed or worsening of symptoms. Call office for any issues.  Educational handout given for bronchitis  Terald Sleeper PA-C [facility] [address]  [place], [place] [address] [phone]   08/08/2016, 8:37 AM

## 2016-08-15 ENCOUNTER — Telehealth: Payer: Self-pay | Admitting: Family Medicine

## 2016-08-15 NOTE — Telephone Encounter (Signed)
Noted, thanks!

## 2016-08-15 NOTE — Telephone Encounter (Signed)
Patient said he has been having tingling in his hands and feet since taking the Prednisone. He is not having any other symptoms, no facial drooping, no weakness, slurred speech.  He is attributing this to the Prednisone but wanted to make you aware what is happening.  He finished the Prednisone yesterday.  He said he would monitor and if he was not feeling better by Thursday he would come in.  Told him I would let you know.

## 2016-08-17 ENCOUNTER — Encounter: Payer: Self-pay | Admitting: Pediatrics

## 2016-08-17 ENCOUNTER — Ambulatory Visit (INDEPENDENT_AMBULATORY_CARE_PROVIDER_SITE_OTHER): Payer: Managed Care, Other (non HMO) | Admitting: Pediatrics

## 2016-08-17 VITALS — BP 138/77 | HR 67 | Temp 97.4°F | Ht 69.0 in | Wt 219.0 lb

## 2016-08-17 DIAGNOSIS — R202 Paresthesia of skin: Secondary | ICD-10-CM

## 2016-08-17 DIAGNOSIS — R531 Weakness: Secondary | ICD-10-CM

## 2016-08-17 DIAGNOSIS — G629 Polyneuropathy, unspecified: Secondary | ICD-10-CM

## 2016-08-17 LAB — URINALYSIS
BILIRUBIN UA: NEGATIVE
GLUCOSE, UA: NEGATIVE
KETONES UA: NEGATIVE
NITRITE UA: NEGATIVE
Protein, UA: NEGATIVE
RBC UA: NEGATIVE
SPEC GRAV UA: 1.02 (ref 1.005–1.030)
UUROB: 0.2 mg/dL (ref 0.2–1.0)
pH, UA: 5.5 (ref 5.0–7.5)

## 2016-08-17 LAB — GLUCOSE HEMOCUE WAIVED: Glu Hemocue Waived: 84 mg/dL (ref 65–99)

## 2016-08-17 NOTE — Progress Notes (Signed)
  Subjective:   Patient ID: [name] [name], male    DOB: 09-Sep-1960, 56 y.o.   MRN: [id number] CC: Numbness (fingertips and toes); Weakness; and Generalized Body Aches  HPI: [name] [name] is a 56 y.o. male presenting for Numbness (fingertips and toes); Weakness; and Generalized Body Aches  Tingling most of the time in fingers and toes  Started with one hand, then now in all of them Was on prednisone and azithromycin recently for acute bronchitis starting about 2 weeks ago Finished prednisone taper pack several days ago No h/o diabetes Normal urination Normal appetite Never had this before  On tenofovir for hep B infection, follows with liver specialist No recent new medications  Relevant past medical, surgical, family and social history reviewed. Allergies and medications reviewed and updated. History  Smoking Status  . Never Smoker  Smokeless Tobacco  . Never Used   ROS: Per HPI   Objective:    BP 138/77   Pulse 67   Temp 97.4 F (36.3 C) (Oral)   Ht '5\' 9"'$  (1.753 m)   Wt 219 lb (99.3 kg)   BMI 32.34 kg/m   Wt Readings from Last 3 Encounters:  08/17/16 219 lb (99.3 kg)  08/08/16 217 lb 3.2 oz (98.5 kg)  05/23/16 220 lb (99.8 kg)    Gen: NAD, alert, cooperative with exam, NCAT EYES: EOMI, no conjunctival injection, or no icterus ENT:  TMs pearly gray b/l, OP without erythema LYMPH: no cervical LAD CV: NRRR, normal S1/S2, no murmur, distal pulses 2+ b/l Resp: CTABL, no wheezes, normal WOB Ext: No edema, warm Neuro: Alert and oriented, sensation equal b/l hands, normal to light touch MSK: equal hand grip b/l 5/5, shoulders abduction/adduction 5/5 b/l, knee ext/flex   Assessment & Plan:  [name] [name] was seen today for numbness, weakness and generalized body aches.  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 sooner if any worsening Assunta Found, MD [place]

## 2016-08-18 LAB — BMP8+EGFR
BUN/Creatinine Ratio: 12 (ref 9–20)
BUN: 18 mg/dL (ref 6–24)
CALCIUM: 9.9 mg/dL (ref 8.7–10.2)
CHLORIDE: 100 mmol/L (ref 96–106)
CO2: 27 mmol/L (ref 18–29)
Creatinine, Ser: 1.5 mg/dL — ABNORMAL HIGH (ref 0.76–1.27)
GFR calc non Af Amer: 51 mL/min/{1.73_m2} — ABNORMAL LOW (ref >59)
GFR, EST AFRICAN AMERICAN: 59 mL/min/{1.73_m2} — AB (ref >59)
GLUCOSE: 90 mg/dL (ref 65–99)
POTASSIUM: 4.7 mmol/L (ref 3.5–5.2)
Sodium: 140 mmol/L (ref 134–144)

## 2016-08-18 LAB — CBC WITH DIFFERENTIAL/PLATELET
BASOS: 0 %
Basophils Absolute: 0 10*3/uL (ref 0.0–0.2)
EOS (ABSOLUTE): 0.2 10*3/uL (ref 0.0–0.4)
EOS: 3 %
Hematocrit: 42.5 % (ref 37.5–51.0)
Hemoglobin: 14.7 g/dL (ref 13.0–17.7)
IMMATURE GRANULOCYTES: 0 %
Immature Grans (Abs): 0 10*3/uL (ref 0.0–0.1)
Lymphocytes Absolute: 2.9 10*3/uL (ref 0.7–3.1)
Lymphs: 40 %
MCH: 30.7 pg (ref 26.6–33.0)
MCHC: 34.6 g/dL (ref 31.5–35.7)
MCV: 89 fL (ref 79–97)
MONOS ABS: 0.7 10*3/uL (ref 0.1–0.9)
Monocytes: 9 %
NEUTROS PCT: 48 %
Neutrophils Absolute: 3.5 10*3/uL (ref 1.4–7.0)
Platelets: 280 10*3/uL (ref 150–379)
RBC: 4.79 x10E6/uL (ref 4.14–5.80)
RDW: 13.4 % (ref 12.3–15.4)
WBC: 7.2 10*3/uL (ref 3.4–10.8)

## 2016-08-18 LAB — TSH: TSH: 1.93 u[IU]/mL (ref 0.450–4.500)

## 2016-08-21 ENCOUNTER — Telehealth: Payer: Self-pay | Admitting: Pediatrics

## 2016-08-21 NOTE — Addendum Note (Signed)
Addended by: Eustaquio Maize on: 08/21/2016 02:07 PM   Modules accepted: Orders

## 2016-08-21 NOTE — Telephone Encounter (Signed)
Pt aware they have not been reviewed yet - will forward to [facility]

## 2016-09-13 ENCOUNTER — Encounter: Payer: Self-pay | Admitting: Diagnostic Neuroimaging

## 2016-09-13 ENCOUNTER — Ambulatory Visit (INDEPENDENT_AMBULATORY_CARE_PROVIDER_SITE_OTHER): Payer: Managed Care, Other (non HMO) | Admitting: Diagnostic Neuroimaging

## 2016-09-13 VITALS — BP 151/74 | HR 73 | Ht 69.0 in | Wt 221.0 lb

## 2016-09-13 DIAGNOSIS — R200 Anesthesia of skin: Secondary | ICD-10-CM

## 2016-09-13 DIAGNOSIS — R531 Weakness: Secondary | ICD-10-CM

## 2016-09-13 DIAGNOSIS — G610 Guillain-Barre syndrome: Secondary | ICD-10-CM | POA: Diagnosis not present

## 2016-09-13 NOTE — Progress Notes (Signed)
[facility]  PATIENT: [name] [name] DOB: September 02, 1960  REFERRING CLINICIAN: Assunta Found, MD HISTORY FROM: patient  REASON FOR VISIT: new consult   HISTORICAL  CHIEF COMPLAINT:  Chief Complaint  Patient presents with  . Neuropathy    rm 6, New Pt, "numbness in fingers, toes-began after prednisone dose pack; numbness resloved now"    HISTORY OF PRESENT ILLNESS:   56 year old right-handed male here for evaluation of numbness and tingling. End of December 2017 patient had acute bronchitis and was treated with prednisone and azithromycin. After completing seven-day course he developed onset of numbness and tingling in his left hand, then his right hand, then his toes. He is having weakness in his lower extremity is. He was having difficulty walking. Symptoms lasted for 2-3 weeks and then gradually improved. Now symptoms are essentially resolved.  No falls, accidents, traumas. No slurred speech or trouble talking. No vision changes. No bowel or bladder incontinence. Patient feels back to normal.   REVIEW OF SYSTEMS: Full 14 system review of systems performed and negative with exception of: Weakness and numbness now resolved.  ALLERGIES: No Known Allergies  HOME MEDICATIONS: Outpatient Medications Prior to Visit  Medication Sig Dispense Refill  . amLODipine (NORVASC) 5 MG tablet TAKE 1 AND 1/2 TABLET DAILY 135 tablet 1  . tenofovir (VIREAD) 300 MG tablet Take 1 tablet (300 mg total) by mouth daily. (Patient taking differently: Take 300 mg by mouth every other day. ) 30 tablet 4  . VEMLIDY 25 MG TABS Take 1 tablet by mouth daily.     No facility-administered medications prior to visit.     PAST MEDICAL HISTORY: Past Medical History:  Diagnosis Date  . Hep B w/o coma, chronic, w/o delta (HCC)   . Hypertension   . Tattoos     PAST SURGICAL HISTORY: No past surgical history on file.  FAMILY HISTORY: Family History  Problem Relation Age of Onset  .  Diabetes Mother   . Hypertension Mother   . Diabetes Father     SOCIAL HISTORY:  Social History   Social History  . Marital status: Divorced    Spouse name: N/A  . Number of children: 1  . Years of education: 46   Occupational History  .      engineer   Social History Main Topics  . Smoking status: Never Smoker  . Smokeless tobacco: Never Used  . Alcohol use No  . Drug use: No  . Sexual activity: Not on file   Other Topics Concern  . Not on file   Social History Narrative   Lives alone   Caffeine- none     PHYSICAL EXAM  GENERAL EXAM/CONSTITUTIONAL: Vitals:  Vitals:   09/13/16 0834  BP: (!) 151/74  Pulse: 73  Weight: 221 lb (100.2 kg)  Height: 5\' 9"  (1.753 m)     Body mass index is 32.64 kg/m.  Visual Acuity Screening   Right eye Left eye Both eyes  Without correction: 20/40 20/40   With correction:        Patient is in no distress; well developed, nourished and groomed; neck is supple  CARDIOVASCULAR:  Examination of carotid arteries is normal; no carotid bruits  Regular rate and rhythm, no murmurs  Examination of peripheral vascular system by observation and palpation is normal  EYES:  Ophthalmoscopic exam of optic discs and posterior segments is normal; no papilledema or hemorrhages  MUSCULOSKELETAL:  Gait, strength, tone, movements noted in Neurologic exam below  NEUROLOGIC: MENTAL STATUS:  No flowsheet data found.  awake, alert, oriented to person, place and time  recent and remote memory intact  normal attention and concentration  language fluent, comprehension intact, naming intact,   fund of knowledge appropriate  CRANIAL NERVE:   2nd - no papilledema on fundoscopic exam  2nd, 3rd, 4th, 6th - pupils equal and reactive to light, visual fields full to confrontation, extraocular muscles intact, no nystagmus  5th - facial sensation symmetric  7th - facial strength symmetric  8th - hearing intact  9th - palate  elevates symmetrically, uvula midline  11th - shoulder shrug symmetric  12th - tongue protrusion midline  MOTOR:   normal bulk and tone, full strength in the BUE, BLE  SENSORY:   normal and symmetric to light touch, pinprick, temperature, vibration  EXCEPT DECR TEMP IN FEET  COORDINATION:   finger-nose-finger, fine finger movements normal  REFLEXES:   deep tendon reflexes --> BUE TRACE; BLE ABSENT  GAIT/STATION:   narrow based gait; able to walk on toes, heels and tandem; romberg is negative    DIAGNOSTIC DATA (LABS, IMAGING, TESTING) - I reviewed patient records, labs, notes, testing and imaging myself where available.  Lab Results  Component Value Date   WBC 7.2 08/17/2016   HCT 42.5 08/17/2016   MCV 89 08/17/2016   PLT 280 08/17/2016      Component Value Date/Time   NA 140 08/17/2016 1042   K 4.7 08/17/2016 1042   CL 100 08/17/2016 1042   CO2 27 08/17/2016 1042   GLUCOSE 90 08/17/2016 1042   GLUCOSE 112 (H) 02/01/2012 2237   BUN 18 08/17/2016 1042   CREATININE 1.50 (H) 08/17/2016 1042   CREATININE 1.50 (H) 02/01/2012 2237   CALCIUM 9.9 08/17/2016 1042   PROT 7.4 05/23/2016 0938   ALBUMIN 4.7 05/23/2016 0938   AST 71 (H) 05/23/2016 0938   ALT 40 05/23/2016 0938   ALKPHOS 75 05/23/2016 0938   BILITOT 0.5 05/23/2016 0938   GFRNONAA 51 (L) 08/17/2016 1042   GFRAA 59 (L) 08/17/2016 1042   Lab Results  Component Value Date   CHOL 135 05/23/2016   HDL 38 (L) 05/23/2016   LDLCALC 81 05/23/2016   TRIG 79 05/23/2016   CHOLHDL 3.6 05/23/2016   No results found for: HGBA1C No results found for: VITAMINB12 Lab Results  Component Value Date   TSH 1.930 08/17/2016    02/26/13 xray cervical spine [I reviewed images myself and agree with interpretation. -VRP]  - Minimal scoliosis. - No acute abnormalities.     ASSESSMENT AND PLAN  56 y.o. year old male here with 2-3 week episode of progressive numbness and weakness in upper and lower extremities,  following a upper respiratory infection. 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 Other considerations would include medication side effect or other postviral phenomenon. Patient is doing well now.    Ddx: suspected guillain-barre syndrome (resolved); less likely medication side effect, other neuropathy, other myopathy  1. Acute inflammatory neuropathy ([place])   2. Numbness   3. Weakness      PLAN: - symptoms essentially resolved; ok to monitor for now - no further neurologic testing or treatment advised at this time - may return in future if symptoms return, increase or change  Return if symptoms worsen or fail to improve, for return to PCP.    Penni Bombard, MD [id number], [id number] AM Certified in Neurology, Neurophysiology and Neuroimaging  [facility] [id number]  [address] [place], [place] [address] [phone]

## 2016-09-13 NOTE — Patient Instructions (Addendum)

## 2016-10-04 ENCOUNTER — Ambulatory Visit
Admission: RE | Admit: 2016-10-04 | Discharge: 2016-10-04 | Disposition: A | Discharge status: Home / Self Care | Payer: Managed Care, Other (non HMO) | Source: Ambulatory Visit | Attending: Nurse Practitioner | Admitting: Nurse Practitioner

## 2016-10-04 DIAGNOSIS — B181 Chronic viral hepatitis B without delta-agent: Secondary | ICD-10-CM

## 2016-11-03 ENCOUNTER — Telehealth: Payer: Self-pay | Admitting: Family Medicine

## 2016-11-03 NOTE — Telephone Encounter (Signed)
Faxed 10/31/16, notified pt

## 2016-11-03 NOTE — Telephone Encounter (Signed)
plz ck on a assetment form for ins

## 2017-04-07 ENCOUNTER — Other Ambulatory Visit: Payer: Self-pay | Admitting: Family Medicine

## 2017-05-05 ENCOUNTER — Other Ambulatory Visit: Payer: Self-pay | Admitting: Family Medicine

## 2017-05-24 ENCOUNTER — Encounter: Payer: Self-pay | Admitting: Family Medicine

## 2017-05-24 ENCOUNTER — Ambulatory Visit (INDEPENDENT_AMBULATORY_CARE_PROVIDER_SITE_OTHER): Payer: Managed Care, Other (non HMO) | Admitting: Family Medicine

## 2017-05-24 VITALS — BP 125/71 | HR 71 | Temp 97.0°F | Ht 69.0 in | Wt 213.4 lb

## 2017-05-24 DIAGNOSIS — Z0000 Encounter for general adult medical examination without abnormal findings: Secondary | ICD-10-CM | POA: Diagnosis not present

## 2017-05-24 DIAGNOSIS — B181 Chronic viral hepatitis B without delta-agent: Secondary | ICD-10-CM

## 2017-05-24 LAB — BAYER DCA HB A1C WAIVED: HB A1C (BAYER DCA - WAIVED): 5.8 % (ref <7.0)

## 2017-05-24 MED ORDER — AMLODIPINE BESYLATE 5 MG PO TABS: ORAL_TABLET | ORAL | 6 refills | Status: DC

## 2017-05-24 MED ORDER — AMLODIPINE BESYLATE 5 MG PO TABS: ORAL_TABLET | ORAL | 3 refills | Status: DC

## 2017-05-24 NOTE — Progress Notes (Signed)
   HPI  Patient presents today for a physical exam and labs for hepatitis B.  Patient is fasting.  Patient is very active working out about 6 times a week. He is also watching his diet carefully.  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  He is doing well with hepatitis B treatment with liver care and would like labs drawn today.  He would also like PSA checked, asymptomatic.  He has multiple family murmurs with history of type 2 diabetes  PMH: Smoking status noted ROS: Per HPI  Objective: BP 125/71   Pulse 71   Temp (!) 97 F (36.1 C) (Oral)   Ht '5\' 9"'$  (1.753 m)   Wt 213 lb 6.4 oz (96.8 kg)   BMI 31.51 kg/m  Gen: NAD, alert, cooperative with exam, muscular HEENT: NCAT, EOMI, PERRL CV: RRR, good S1/S2, no murmur Resp: CTABL, no wheezes, non-labored Abd: SNTND, BS present, no guarding or organomegaly Ext: No edema, warm Neuro: Alert and oriented, No gross deficits  Assessment and plan:  # Annual physical exam Normal exam Labs drawn, multiple additional labs for hepatitis B. Patient has follow-up with hepatology in about 1 week  He will have a flu shot at work.      Orders Placed This Encounter  Procedures  . PSA  . AFP tumor marker  . Hepatitis B e antigen  . Hepatitis B e antibody  . Hepatitis B surface antibody  . Hepatitis B DNA, ultraquantitative, PCR  . Hepatitis B surface antigen  . PT and PTT  . Phosphorus  . CBC with Differential/Platelet  . BMP8+EGFR  . Hepatic function panel  . Bayer DCA Hb A1c Waived    Meds ordered this encounter  Medications  . amLODipine (NORVASC) 5 MG tablet    Sig: TAKE 1 AND 1/2 TABLET DAILY    Dispense:  45 tablet    Refill:  [place], MD [facility] 05/24/2017, 10:15 AM

## 2017-05-24 NOTE — Patient Instructions (Signed)
Great to see you!  Keep up the good work, come back in 1 year unless you  Need [place] sooner.    Health Maintenance, Male A healthy lifestyle and preventive care is important for your health and wellness. Ask your health care provider about what schedule of regular examinations is right for you. What should I know about weight and diet? Eat a Healthy Diet  Eat plenty of vegetables, fruits, whole grains, low-fat dairy products, and lean protein.  Do not eat a lot of foods high in solid fats, added sugars, or salt.  Maintain a Healthy Weight Regular exercise can help you achieve or maintain a healthy weight. You should:  Do at least 150 minutes of exercise each week. The exercise should increase your heart rate and make you sweat (moderate-intensity exercise).  Do strength-training exercises at least twice a week.  Watch Your Levels of Cholesterol and Blood Lipids  Have your blood tested for lipids and cholesterol every 5 years starting at 57 years of age. If you are at high risk for heart disease, you should start having your blood tested when you are 57 years old. You may need to have your cholesterol levels checked more often if: ? Your lipid or cholesterol levels are high. ? You are older than 57 years of age. ? You are at high risk for heart disease.  What should I know about cancer screening? Many types of cancers can be detected early and may often be prevented. Lung Cancer  You should be screened every year for lung cancer if: ? You are a current smoker who has smoked for at least 30 years. ? You are a former smoker who has quit within the past 15 years.  Talk to your health care provider about your screening options, when you should start screening, and how often you should be screened.  Colorectal Cancer  Routine colorectal cancer screening usually begins at 57 years of age and should be repeated every 5-10 years until you are 57 years old. You may need to be screened more  often if early forms of precancerous polyps or small growths are found. Your health care provider may recommend screening at an earlier age if you have risk factors for colon cancer.  Your health care provider may recommend using home test kits to check for hidden blood in the stool.  A small camera at the end of a tube can be used to examine your colon (sigmoidoscopy or colonoscopy). This checks for the earliest forms of colorectal cancer.  Prostate and Testicular Cancer  Depending on your age and overall health, your health care provider may do certain tests to screen for prostate and testicular cancer.  Talk to your health care provider about any symptoms or concerns you have about testicular or prostate cancer.  Skin Cancer  Check your skin from head to toe regularly.  Tell your health care provider about any new moles or changes in moles, especially if: ? There is a change in a mole's size, shape, or color. ? You have a mole that is larger than a pencil eraser.  Always use sunscreen. Apply sunscreen liberally and repeat throughout the day.  Protect yourself by wearing long sleeves, pants, a wide-brimmed hat, and sunglasses when outside.  What should I know about heart disease, diabetes, and high blood pressure?  If you are 84-3 years of age, have your blood pressure checked every 3-5 years. If you are 42 years of age or older, have your  blood pressure checked every year. You should have your blood pressure measured twice-once when you are at a hospital or clinic, and once when you are not at a hospital or clinic. Record the average of the two measurements. To check your blood pressure when you are not at a hospital or clinic, you can use: ? An automated blood pressure machine at a pharmacy. ? A home blood pressure monitor.  Talk to your health care provider about your target blood pressure.  If you are between 79-34 years old, ask your health care provider if you should take  aspirin to prevent heart disease.  Have regular diabetes screenings by checking your fasting blood sugar level. ? If you are at a normal weight and have a low risk for diabetes, have this test once every three years after the age of 23. ? If you are overweight and have a high risk for diabetes, consider being tested at a younger age or more often.  A one-time screening for abdominal aortic aneurysm (AAA) by ultrasound is recommended for men aged 83-75 years who are current or former smokers. What should I know about preventing infection? Hepatitis B If you have a higher risk for hepatitis B, you should be screened for this virus. Talk with your health care provider to find out if you are at risk for hepatitis B infection. Hepatitis C Blood testing is recommended for:  Everyone born from [birth year] through 1966.  Anyone with known risk factors for hepatitis C.  Sexually Transmitted Diseases (STDs)  You should be screened each year for STDs including gonorrhea and chlamydia if: ? You are sexually active and are younger than 57 years of age. ? You are older than 57 years of age and your health care provider tells you that you are at risk for this type of infection. ? Your sexual activity has changed since you were last screened and you are at an increased risk for chlamydia or gonorrhea. Ask your health care provider if you are at risk.  Talk with your health care provider about whether you are at high risk of being infected with HIV. Your health care provider may recommend a prescription medicine to help prevent HIV infection.  What else can I do?  Schedule regular health, dental, and eye exams.  Stay current with your vaccines (immunizations).  Do not use any tobacco products, such as cigarettes, chewing tobacco, and e-cigarettes. If you need help quitting, ask your health care provider.  Limit alcohol intake to no more than 2 drinks per day. One drink equals 12 ounces of beer, 5 ounces of  wine, or 1 ounces of hard liquor.  Do not use street drugs.  Do not share needles.  Ask your health care provider for help if you need support or information about quitting drugs.  Tell your health care provider if you often feel depressed.  Tell your health care provider if you have ever been abused or do not feel safe at home. This information is not intended to replace advice given to you by your health care provider. Make sure you discuss any questions you have with your health care provider. Document Released: 01/27/2008 Document Revised: 03/29/2016 Document Reviewed: 05/04/2015 Elsevier Interactive Patient Education  [employer].

## 2017-05-25 LAB — CBC WITH DIFFERENTIAL/PLATELET
BASOS ABS: 0 10*3/uL (ref 0.0–0.2)
BASOS: 0 %
EOS (ABSOLUTE): 0.1 10*3/uL (ref 0.0–0.4)
Eos: 2 %
HEMOGLOBIN: 15.4 g/dL (ref 13.0–17.7)
Hematocrit: 45.5 % (ref 37.5–51.0)
Immature Grans (Abs): 0 10*3/uL (ref 0.0–0.1)
Immature Granulocytes: 0 %
LYMPHS ABS: 2.6 10*3/uL (ref 0.7–3.1)
Lymphs: 50 %
MCH: 30.9 pg (ref 26.6–33.0)
MCHC: 33.8 g/dL (ref 31.5–35.7)
MCV: 91 fL (ref 79–97)
Monocytes Absolute: 0.3 10*3/uL (ref 0.1–0.9)
Monocytes: 5 %
NEUTROS ABS: 2.2 10*3/uL (ref 1.4–7.0)
Neutrophils: 43 %
Platelets: 196 10*3/uL (ref 150–379)
RBC: 4.99 x10E6/uL (ref 4.14–5.80)
RDW: 13.7 % (ref 12.3–15.4)
WBC: 5.3 10*3/uL (ref 3.4–10.8)

## 2017-05-25 LAB — BMP8+EGFR
BUN/Creatinine Ratio: 9 (ref 9–20)
BUN: 14 mg/dL (ref 6–24)
CO2: 25 mmol/L (ref 20–29)
Calcium: 9.5 mg/dL (ref 8.7–10.2)
Chloride: 103 mmol/L (ref 96–106)
Creatinine, Ser: 1.54 mg/dL — ABNORMAL HIGH (ref 0.76–1.27)
GFR calc Af Amer: 57 mL/min/{1.73_m2} — ABNORMAL LOW (ref >59)
GFR, EST NON AFRICAN AMERICAN: 49 mL/min/{1.73_m2} — AB (ref >59)
GLUCOSE: 117 mg/dL — AB (ref 65–99)
POTASSIUM: 4.6 mmol/L (ref 3.5–5.2)
SODIUM: 143 mmol/L (ref 134–144)

## 2017-05-25 LAB — PT AND PTT
APTT: 27 s (ref 24–33)
INR: 1.1 (ref 0.8–1.2)
PROTHROMBIN TIME: 11.3 s (ref 9.1–12.0)

## 2017-05-25 LAB — HEPATITIS B E ANTIBODY: Hep B E Ab: NEGATIVE

## 2017-05-25 LAB — HEPATIC FUNCTION PANEL
ALBUMIN: 4.9 g/dL (ref 3.5–5.5)
ALT: 42 IU/L (ref 0–44)
AST: 42 IU/L — AB (ref 0–40)
Alkaline Phosphatase: 66 IU/L (ref 39–117)
Bilirubin Total: 0.4 mg/dL (ref 0.0–1.2)
Bilirubin, Direct: 0.12 mg/dL (ref 0.00–0.40)
TOTAL PROTEIN: 7.4 g/dL (ref 6.0–8.5)

## 2017-05-25 LAB — HEPATITIS B SURFACE ANTIBODY,QUALITATIVE: Hep B Surface Ab, Qual: NONREACTIVE

## 2017-05-25 LAB — HEPATITIS B DNA, ULTRAQUANTITATIVE, PCR
HBV DNA SERPL PCR-ACNC: 30 IU/mL
HBV DNA SERPL PCR-LOG IU: 1.477 log10 IU/mL

## 2017-05-25 LAB — AFP TUMOR MARKER: AFP, SERUM, TUMOR MARKER: 2.8 ng/mL (ref 0.0–8.3)

## 2017-05-25 LAB — HEPATITIS B E ANTIGEN: Hep B E Ag: POSITIVE — AB

## 2017-05-25 LAB — PSA: PROSTATE SPECIFIC AG, SERUM: 0.5 ng/mL (ref 0.0–4.0)

## 2017-05-25 LAB — PHOSPHORUS: PHOSPHORUS: 2.9 mg/dL (ref 2.5–4.5)

## 2017-05-25 LAB — HEPATITIS B SURFACE ANTIGEN: HEP B S AG: POSITIVE — AB

## 2017-05-30 ENCOUNTER — Other Ambulatory Visit: Payer: Self-pay | Admitting: Nurse Practitioner

## 2017-05-30 DIAGNOSIS — B181 Chronic viral hepatitis B without delta-agent: Secondary | ICD-10-CM

## 2017-06-06 ENCOUNTER — Other Ambulatory Visit: Payer: Managed Care, Other (non HMO)

## 2017-06-07 ENCOUNTER — Ambulatory Visit
Admission: RE | Admit: 2017-06-07 | Discharge: 2017-06-07 | Disposition: A | Discharge status: Home / Self Care | Payer: Managed Care, Other (non HMO) | Source: Ambulatory Visit | Attending: Nurse Practitioner | Admitting: Nurse Practitioner

## 2017-06-07 DIAGNOSIS — B181 Chronic viral hepatitis B without delta-agent: Secondary | ICD-10-CM

## 2017-06-18 ENCOUNTER — Other Ambulatory Visit: Payer: Self-pay | Admitting: *Deleted

## 2017-06-18 MED ORDER — AMLODIPINE BESYLATE 5 MG PO TABS: ORAL_TABLET | ORAL | 3 refills | Status: DC

## 2017-06-18 NOTE — Telephone Encounter (Signed)
TC to pt he is now having to use mail order pharmacy

## 2017-11-14 ENCOUNTER — Other Ambulatory Visit: Payer: Self-pay | Admitting: Nurse Practitioner

## 2017-11-14 DIAGNOSIS — B181 Chronic viral hepatitis B without delta-agent: Secondary | ICD-10-CM

## 2017-11-26 ENCOUNTER — Ambulatory Visit
Admission: RE | Admit: 2017-11-26 | Discharge: 2017-11-26 | Disposition: A | Discharge status: Home / Self Care | Payer: Self-pay | Source: Ambulatory Visit | Attending: Nurse Practitioner | Admitting: Nurse Practitioner

## 2017-11-26 DIAGNOSIS — B181 Chronic viral hepatitis B without delta-agent: Secondary | ICD-10-CM

## 2018-05-23 ENCOUNTER — Other Ambulatory Visit: Payer: Self-pay | Admitting: Nurse Practitioner

## 2018-05-23 DIAGNOSIS — B181 Chronic viral hepatitis B without delta-agent: Secondary | ICD-10-CM

## 2018-05-28 ENCOUNTER — Encounter: Payer: Self-pay | Admitting: *Deleted

## 2018-06-07 ENCOUNTER — Encounter: Payer: Managed Care, Other (non HMO) | Admitting: Family Medicine

## 2018-06-10 ENCOUNTER — Other Ambulatory Visit: Payer: Managed Care, Other (non HMO)

## 2018-06-13 ENCOUNTER — Encounter: Payer: Self-pay | Admitting: Family Medicine

## 2018-06-13 ENCOUNTER — Ambulatory Visit (INDEPENDENT_AMBULATORY_CARE_PROVIDER_SITE_OTHER): Payer: BLUE CROSS/BLUE SHIELD | Admitting: Family Medicine

## 2018-06-13 VITALS — BP 133/67 | HR 66 | Temp 98.1°F | Ht 69.0 in | Wt 205.0 lb

## 2018-06-13 DIAGNOSIS — B351 Tinea unguium: Secondary | ICD-10-CM

## 2018-06-13 DIAGNOSIS — Z0000 Encounter for general adult medical examination without abnormal findings: Secondary | ICD-10-CM

## 2018-06-13 DIAGNOSIS — L308 Other specified dermatitis: Secondary | ICD-10-CM

## 2018-06-13 DIAGNOSIS — E785 Hyperlipidemia, unspecified: Secondary | ICD-10-CM

## 2018-06-13 DIAGNOSIS — N183 Chronic kidney disease, stage 3 unspecified: Secondary | ICD-10-CM

## 2018-06-13 DIAGNOSIS — Z23 Encounter for immunization: Secondary | ICD-10-CM | POA: Diagnosis not present

## 2018-06-13 DIAGNOSIS — I10 Essential (primary) hypertension: Secondary | ICD-10-CM

## 2018-06-13 LAB — BAYER DCA HB A1C WAIVED: HB A1C: 5.9 % (ref <7.0)

## 2018-06-13 MED ORDER — TRIAMCINOLONE ACETONIDE 0.1 % EX CREA: 1.0000 "application " | TOPICAL_CREAM | Freq: Two times a day (BID) | CUTANEOUS | 3 refills | Status: DC

## 2018-06-13 MED ORDER — AMLODIPINE BESYLATE 5 MG PO TABS: ORAL_TABLET | ORAL | 3 refills | Status: DC

## 2018-06-13 NOTE — Progress Notes (Signed)
Subjective:  Patient ID: [name] [name], male    DOB: 03-02-1961  Age: 57 y.o. MRN: [id number]  CC: Annual Exam   HPI [name] [name] presents for Complete physical exam. History of chronic hepatitis B for which [name] [name] uses Vemlidy. So far [name] [name] has had no evidence or the development of cirrhosis or hepatic cancer. Followed by GI.   Depression screen [facility] 2/9 06/13/2018 05/24/2017 08/17/2016  Decreased Interest 0 0 0  Down, Depressed, Hopeless 0 0 0  PHQ - 2 Score 0 0 0  Altered sleeping - - 0  Tired, decreased energy - - 0  Change in appetite - - 0  Feeling bad or failure about yourself  - - 0  Trouble concentrating - - 0  Moving slowly or fidgety/restless - - 0  Suicidal thoughts - - 0  PHQ-9 Score - - 0    History [name] [name] has a past medical history of Hep B w/o coma, chronic, w/o delta ([name] [name]), Hypertension, and Tattoos.   [name] [name] has no past surgical history on file.   His family history includes Diabetes in his father and mother; Hypertension in his mother.[name] [name] reports that [name] [name] has never smoked. [name] [name] has never used smokeless tobacco. [name] [name] reports that [name] [name] does not drink alcohol or use drugs.  Patient Active Problem List   Diagnosis Date Noted  . Onychomycosis of toenail 06/13/2018  . Dyslipidemia 11/07/2013  . CKD (chronic kidney disease) stage 3, GFR 30-59 ml/min (HCC) 08/15/2013  . Hypertension   . Tinea unguium 04/17/2013  . Hep B w/o coma, chronic, w/o delta (HCC)   . Tattoos      ROS Review of Systems  Constitutional: Negative for activity change, fatigue and unexpected weight change.  HENT: Negative for congestion, ear pain, hearing loss, postnasal drip and trouble swallowing.   Eyes: Negative for pain and visual disturbance.  Respiratory: Negative for cough, chest tightness and shortness of breath.   Cardiovascular: Negative for chest pain, palpitations and leg swelling.  Gastrointestinal: Negative for abdominal distention, abdominal pain, blood in stool, constipation,  diarrhea, nausea and vomiting.  Endocrine: Negative for cold intolerance, heat intolerance and polydipsia.  Genitourinary: Negative for difficulty urinating, dysuria, flank pain, frequency and urgency.  Musculoskeletal: Negative for arthralgias and joint swelling.  Skin: Negative for color change, rash and wound.       Fungus under toe nails   Neurological: Negative for dizziness, syncope, speech difficulty, weakness, light-headedness, numbness and headaches.  Hematological: Does not bruise/bleed easily.  Psychiatric/Behavioral: Negative for confusion, decreased concentration, dysphoric mood and sleep disturbance. The patient is not nervous/anxious.     Objective:  BP 133/67   Pulse 66   Temp 98.1 F (36.7 C)   Ht 5' 9"  (1.753 m)   Wt 205 lb (93 kg)   BMI 30.27 kg/m   BP Readings from Last 3 Encounters:  06/13/18 133/67  05/24/17 125/71  09/13/16 (!) 151/74    Wt Readings from Last 3 Encounters:  06/13/18 205 lb (93 kg)  05/24/17 213 lb 6.4 oz (96.8 kg)  09/13/16 221 lb (100.2 kg)     Physical Exam  Constitutional: [name] [name] is oriented to person, place, and time. [name] [name] appears well-developed and well-nourished.  HENT:  Head: Normocephalic and atraumatic.  Mouth/Throat: Oropharynx is clear and moist.  Eyes: Pupils are equal, round, and reactive to light. EOM are normal.  Neck: Normal range of motion. No tracheal deviation present. No thyromegaly present.  Cardiovascular: Normal rate, regular rhythm and normal heart  sounds. Exam reveals no gallop and no friction rub.  No murmur heard. Pulmonary/Chest: Breath sounds normal. [name] [name] has no wheezes. [name] [name] has no rales.  Abdominal: Soft. Bowel sounds are normal. [name] [name] exhibits no distension and no mass. There is no tenderness. Hernia confirmed negative in the right inguinal area and confirmed negative in the left inguinal area.  Genitourinary: Testes normal and penis normal.  Musculoskeletal: Normal range of motion. [name] [name] exhibits no edema.    Lymphadenopathy:    [name] [name] has no cervical adenopathy.  Neurological: [name] [name] is alert and oriented to person, place, and time.  Skin: Skin is warm and dry. Rash (scaly eczema of feet) noted.  Psychiatric: [name] [name] has a normal mood and affect.      Assessment & Plan:   [name] [name] was seen today for annual exam.  Diagnoses and all orders for this visit:  Well adult exam -     CBC with Differential -     CMP14+EGFR -     Lipid panel -     VITAMIN D 25 Hydroxy (Vit-D Deficiency, Fractures) -     PSA, total and free -     Bayer DCA Hb A1c Waived  Need for immunization against influenza -     Flu Vaccine QUAD 36+ mos IM  Essential hypertension -     CBC with Differential -     CMP14+EGFR -     Lipid panel -     Bayer DCA Hb A1c Waived  CKD (chronic kidney disease) stage 3, GFR 30-59 ml/min (HCC)  Dyslipidemia -     CBC with Differential -     CMP14+EGFR -     Lipid panel -     Bayer DCA Hb A1c Waived  Onychomycosis of toenail  Other eczema  Other orders -     triamcinolone cream ([name] [name]) 0.1 %; Apply 1 application topically 2 (two) times daily. Avoid face and genitalia -     amLODipine ([name] [name]) 5 MG tablet; TAKE 1 AND 1/2 TABLET DAILY       I am having [name] [name]. [name] [name] start on triamcinolone cream. I am also having him maintain his VEMLIDY and amLODipine.  Allergies as of 06/13/2018   No Known Allergies     Medication List        Accurate as of 06/13/18 11:59 PM. Always use your most recent med list.          amLODipine 5 MG tablet Commonly known as:  [name] [name] TAKE 1 AND 1/2 TABLET DAILY   triamcinolone cream 0.1 % Commonly known as:  [name] [name] Apply 1 application topically 2 (two) times daily. Avoid face and genitalia   VEMLIDY 25 MG Tabs Generic drug:  Tenofovir Alafenamide Fumarate Take 1 tablet by mouth daily.        Follow-up: Return in about 1 year (around 06/14/2019).  [name] [name], M.D.

## 2018-06-13 NOTE — Patient Instructions (Addendum)
Use Dakins for foot fungus- soak daily.    Earwax removal:  Debrox drops are available without a prescription at your pharmacy.  Lay on your side with the ear up that you want to treat. Place for 5 drops of the Debrox in the ear canal and lay still for 15 minutes. After that time you considered up and allow the excess to run out of the year and catch it with a Kleenex. Repeat this with the other ear if needed.  Repeat this process daily for 1 week. By that time the ear should feel less clogged and her hearing should be better, if not, follow up in the office for recheck of the ear.  Thanks, [employer]

## 2018-06-14 LAB — CBC WITH DIFFERENTIAL/PLATELET
BASOS ABS: 0 10*3/uL (ref 0.0–0.2)
BASOS: 0 %
EOS (ABSOLUTE): 0.1 10*3/uL (ref 0.0–0.4)
EOS: 2 %
HEMATOCRIT: 42.5 % (ref 37.5–51.0)
HEMOGLOBIN: 15 g/dL (ref 13.0–17.7)
IMMATURE GRANS (ABS): 0 10*3/uL (ref 0.0–0.1)
Immature Granulocytes: 0 %
LYMPHS: 48 %
Lymphocytes Absolute: 2.4 10*3/uL (ref 0.7–3.1)
MCH: 30.3 pg (ref 26.6–33.0)
MCHC: 35.3 g/dL (ref 31.5–35.7)
MCV: 86 fL (ref 79–97)
MONOCYTES: 8 %
Monocytes Absolute: 0.4 10*3/uL (ref 0.1–0.9)
NEUTROS ABS: 2.2 10*3/uL (ref 1.4–7.0)
Neutrophils: 42 %
PLATELETS: 189 10*3/uL (ref 150–450)
RBC: 4.95 x10E6/uL (ref 4.14–5.80)
RDW: 12.2 % — ABNORMAL LOW (ref 12.3–15.4)
WBC: 5.1 10*3/uL (ref 3.4–10.8)

## 2018-06-14 LAB — VITAMIN D 25 HYDROXY (VIT D DEFICIENCY, FRACTURES): VIT D 25 HYDROXY: 17 ng/mL — AB (ref 30.0–100.0)

## 2018-06-14 LAB — CMP14+EGFR
ALT: 32 IU/L (ref 0–44)
AST: 37 IU/L (ref 0–40)
Albumin/Globulin Ratio: 1.8 (ref 1.2–2.2)
Albumin: 4.7 g/dL (ref 3.5–5.5)
Alkaline Phosphatase: 69 IU/L (ref 39–117)
BILIRUBIN TOTAL: 0.4 mg/dL (ref 0.0–1.2)
BUN/Creatinine Ratio: 13 (ref 9–20)
BUN: 20 mg/dL (ref 6–24)
CALCIUM: 9.8 mg/dL (ref 8.7–10.2)
CHLORIDE: 102 mmol/L (ref 96–106)
CO2: 25 mmol/L (ref 20–29)
CREATININE: 1.59 mg/dL — AB (ref 0.76–1.27)
GFR calc non Af Amer: 47 mL/min/{1.73_m2} — ABNORMAL LOW (ref >59)
GFR, EST AFRICAN AMERICAN: 55 mL/min/{1.73_m2} — AB (ref >59)
GLUCOSE: 106 mg/dL — AB (ref 65–99)
Globulin, Total: 2.6 g/dL (ref 1.5–4.5)
Potassium: 4.6 mmol/L (ref 3.5–5.2)
Sodium: 142 mmol/L (ref 134–144)
TOTAL PROTEIN: 7.3 g/dL (ref 6.0–8.5)

## 2018-06-14 LAB — PSA, TOTAL AND FREE
PSA, Free Pct: 41.7 %
PSA, Free: 0.25 ng/mL
Prostate Specific Ag, Serum: 0.6 ng/mL (ref 0.0–4.0)

## 2018-06-14 LAB — LIPID PANEL
Chol/HDL Ratio: 3.2 ratio (ref 0.0–5.0)
Cholesterol, Total: 167 mg/dL (ref 100–199)
HDL: 52 mg/dL (ref >39)
LDL Calculated: 104 mg/dL — ABNORMAL HIGH (ref 0–99)
Triglycerides: 54 mg/dL (ref 0–149)
VLDL Cholesterol Cal: 11 mg/dL (ref 5–40)

## 2018-06-18 ENCOUNTER — Telehealth: Payer: Self-pay | Admitting: Family Medicine

## 2018-06-18 ENCOUNTER — Other Ambulatory Visit: Payer: Self-pay | Admitting: *Deleted

## 2018-06-18 MED ORDER — VITAMIN D (ERGOCALCIFEROL) 1.25 MG (50000 UNIT) PO CAPS: 50000.0000 [IU] | ORAL_CAPSULE | ORAL | 1 refills | Status: DC

## 2018-06-26 ENCOUNTER — Ambulatory Visit
Admission: RE | Admit: 2018-06-26 | Discharge: 2018-06-26 | Disposition: A | Discharge status: Home / Self Care | Payer: Self-pay | Source: Ambulatory Visit | Attending: Nurse Practitioner | Admitting: Nurse Practitioner

## 2018-06-26 DIAGNOSIS — B181 Chronic viral hepatitis B without delta-agent: Secondary | ICD-10-CM

## 2018-09-11 ENCOUNTER — Ambulatory Visit: Payer: BLUE CROSS/BLUE SHIELD | Admitting: Nurse Practitioner

## 2018-09-11 ENCOUNTER — Encounter: Payer: Self-pay | Admitting: Nurse Practitioner

## 2018-09-11 VITALS — BP 150/77 | HR 89 | Temp 97.4°F | Ht 69.0 in | Wt 207.0 lb

## 2018-09-11 DIAGNOSIS — R05 Cough: Secondary | ICD-10-CM | POA: Diagnosis not present

## 2018-09-11 DIAGNOSIS — R059 Cough, unspecified: Secondary | ICD-10-CM

## 2018-09-11 DIAGNOSIS — R52 Pain, unspecified: Secondary | ICD-10-CM

## 2018-09-11 LAB — VERITOR FLU A/B WAIVED
INFLUENZA B: NEGATIVE
Influenza A: NEGATIVE

## 2018-09-11 MED ORDER — BENZONATATE 100 MG PO CAPS: 100.0000 mg | ORAL_CAPSULE | Freq: Three times a day (TID) | ORAL | 0 refills | Status: DC | PRN

## 2018-09-11 NOTE — Progress Notes (Signed)
   Subjective:    Patient ID: [name] [name], male    DOB: Jan 21, 1961, 58 y.o.   MRN: [id number]   Chief Complaint: Fever; Cough; and Generalized Body Aches   HPI Patient come sin today c/o generalized body aches all the sudden last night. Fever of 101 last night. Cough that started this morning.    Review of Systems  Constitutional: Positive for appetite change, chills and fever.  HENT: Positive for congestion and rhinorrhea. Negative for ear pain, sinus pressure, sinus pain, sore throat and trouble swallowing.   Respiratory: Positive for cough.   Cardiovascular: Negative.   Gastrointestinal: Negative.   Genitourinary: Negative.   Musculoskeletal: Negative.   Neurological: Negative for headaches.  All other systems reviewed and are negative.      Objective:   Physical Exam Constitutional:      General: He is in acute distress.     Appearance: Normal appearance. He is normal weight.  HENT:     Right Ear: Hearing, tympanic membrane, ear canal and external ear normal.     Left Ear: Hearing, tympanic membrane, ear canal and external ear normal.     Nose: Mucosal edema, congestion and rhinorrhea present.     Right Turbinates: Not swollen or pale.     Left Turbinates: Not swollen or pale.     Right Sinus: No maxillary sinus tenderness or frontal sinus tenderness.     Left Sinus: No maxillary sinus tenderness or frontal sinus tenderness.     Mouth/Throat:     Lips: Pink.     Mouth: Mucous membranes are moist.  Neck:     Musculoskeletal: Normal range of motion and neck supple.  Cardiovascular:     Rate and Rhythm: Normal rate and regular rhythm.     Heart sounds: Normal heart sounds.  Pulmonary:     Effort: Pulmonary effort is normal.     Breath sounds: Normal breath sounds. No wheezing or rales.     Comments: Dry cough Lymphadenopathy:     Cervical: No cervical adenopathy.  Skin:    General: Skin is warm and dry.  Neurological:     General: No focal deficit  present.     Mental Status: He is alert and oriented to person, place, and time.  Psychiatric:        Mood and Affect: Mood normal.        Behavior: Behavior normal.      BP (!) 150/77   Pulse 89   Temp (!) 97.4 F (36.3 C) (Oral)   Ht 5\' 9"  (1.753 m)   Wt 207 lb (93.9 kg)   BMI 30.57 kg/m       Assessment & Plan:  [name] [name] in today with chief complaint of Fever; Cough; and Generalized Body Aches   1. Body aches - Veritor Flu A/B Waived  2. Cough Meds ordered this encounter  Medications  . benzonatate (TESSALON PERLES) 100 MG capsule    Sig: Take 1 capsule (100 mg total) by mouth 3 (three) times daily as needed for cough.    Dispense:  20 capsule    Refill:  0    Order Specific Question:   Supervising Provider    Answer:   Caryl Pina A [[id number]]   motrin or tylenol OTC for fever and body aches Force fluids humidifier RTO prn  Mary-Margaret Hassell Done, FNP

## 2018-12-15 ENCOUNTER — Other Ambulatory Visit: Payer: Self-pay | Admitting: Family Medicine

## 2019-02-05 ENCOUNTER — Other Ambulatory Visit: Payer: Self-pay | Admitting: Nurse Practitioner

## 2019-02-05 DIAGNOSIS — K7469 Other cirrhosis of liver: Secondary | ICD-10-CM

## 2019-02-19 ENCOUNTER — Ambulatory Visit
Admission: RE | Admit: 2019-02-19 | Discharge: 2019-02-19 | Disposition: A | Discharge status: Home / Self Care | Payer: BC Managed Care – PPO | Source: Ambulatory Visit | Attending: Nurse Practitioner | Admitting: Nurse Practitioner

## 2019-02-19 DIAGNOSIS — K7469 Other cirrhosis of liver: Secondary | ICD-10-CM

## 2019-03-12 ENCOUNTER — Other Ambulatory Visit: Payer: Self-pay | Admitting: Family Medicine

## 2019-04-02 IMAGING — US US ABDOMEN LIMITED
1 series · 14 of 25 positions shown · non-contrast
Comparison: 10/04/2016

CLINICAL DATA: Hepatitis-B, history hypertension

EXAM:
ULTRASOUND ABDOMEN LIMITED RIGHT UPPER QUADRANT

[Series 1: us abdomen limited · 0.19mm/px · 14 of 39 slices shown]
[im 1/39]
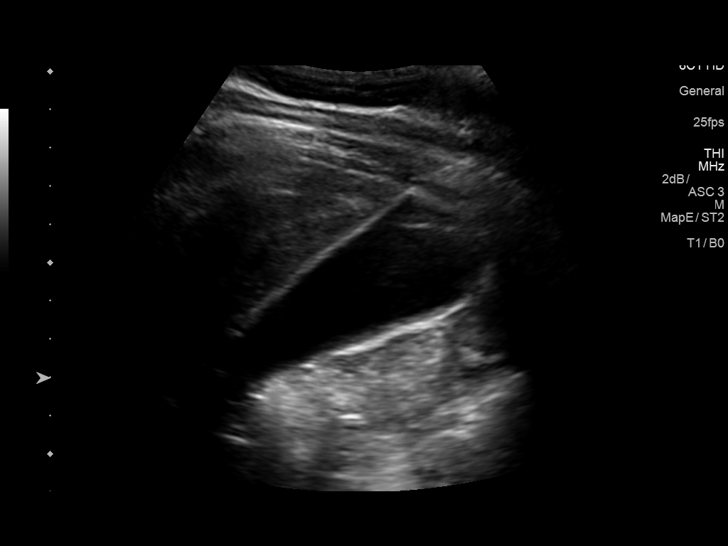
[im 4/39]
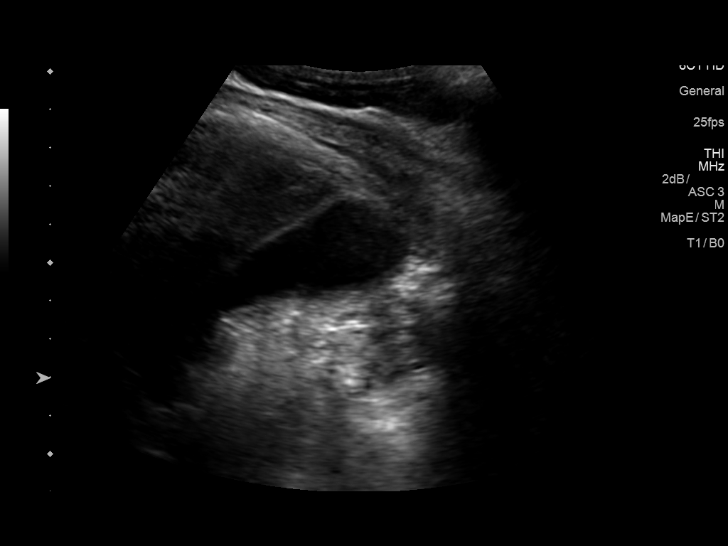
[im 7/39]
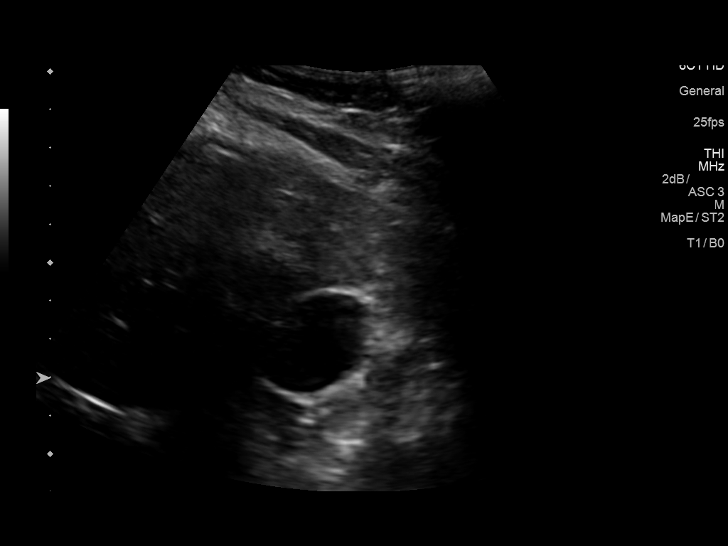
[im 10/39]
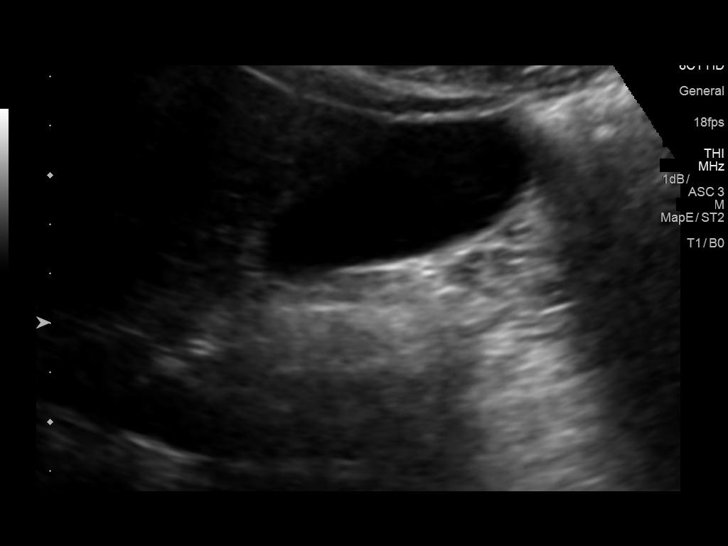
[im 13/39]
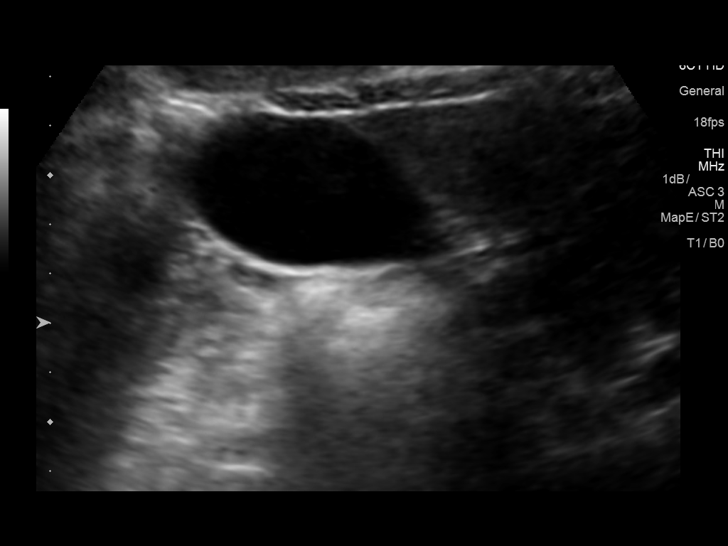
[im 15/39]
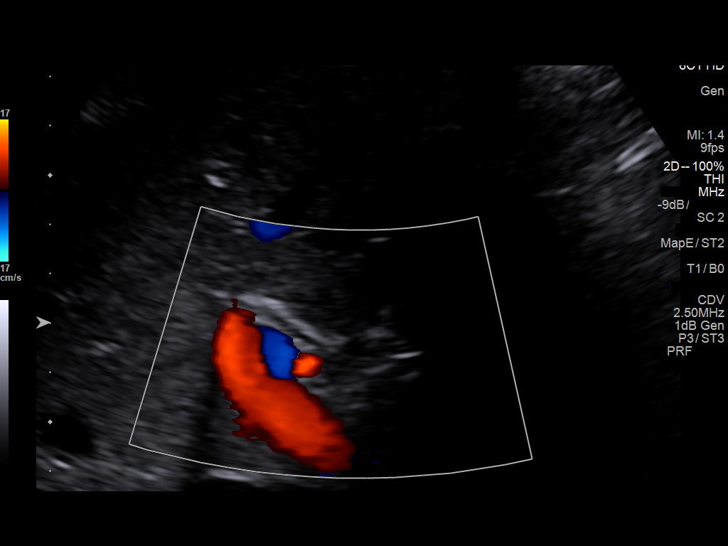
[im 18/39]
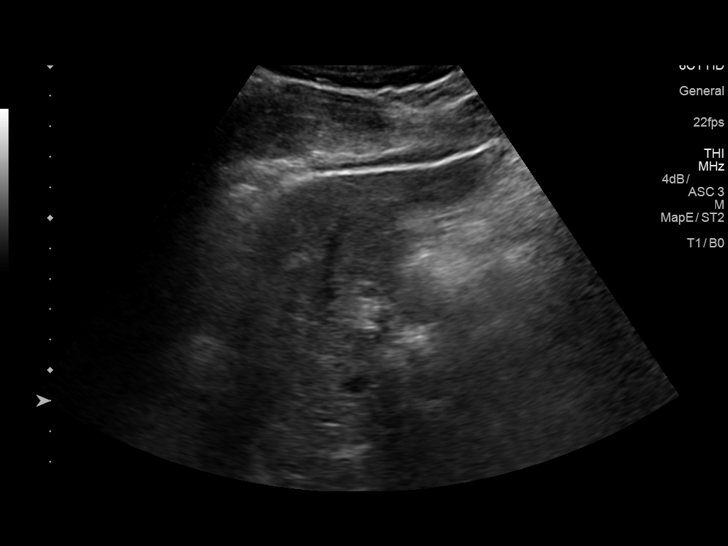
[im 21/39]
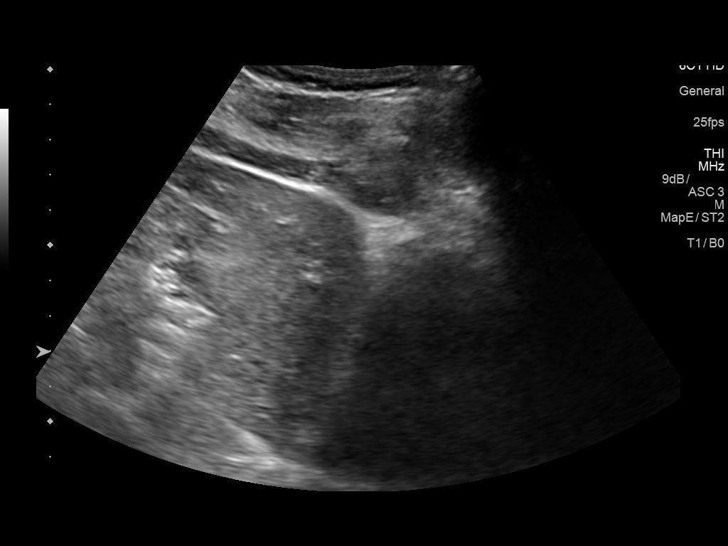
[im 24/39]
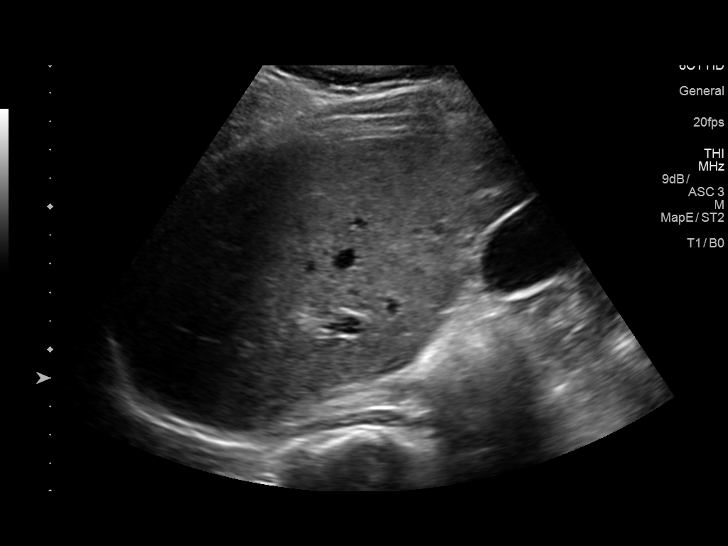
[im 26/39]
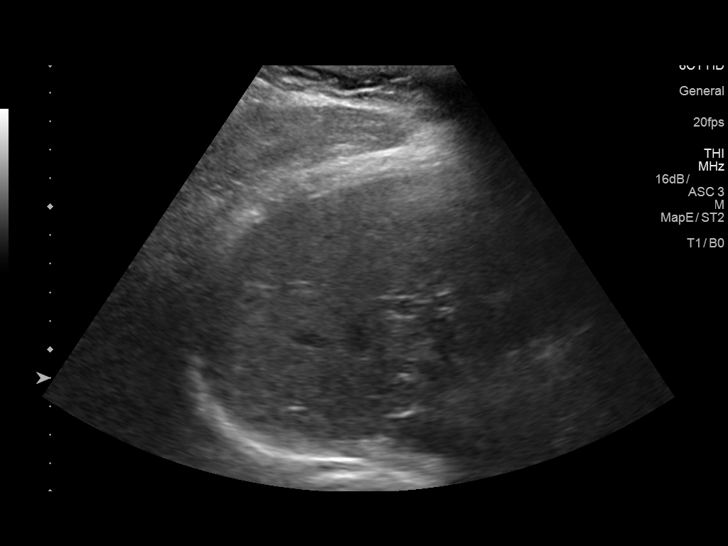
[im 29/39]
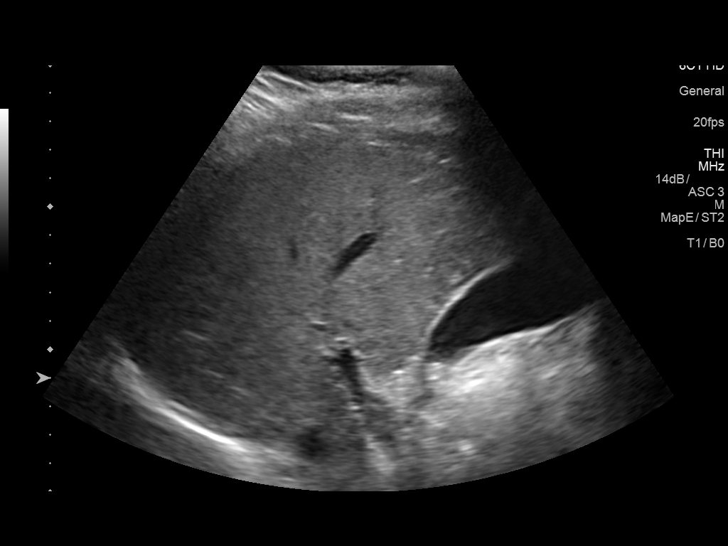
[im 32/39]
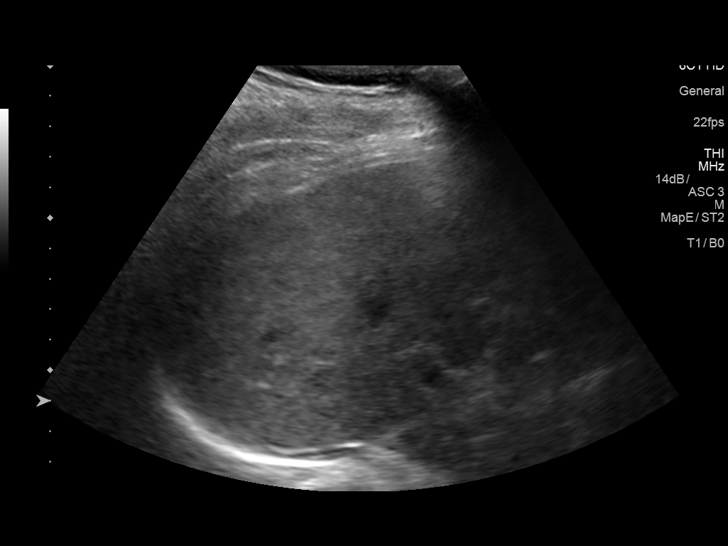
[im 35/39]
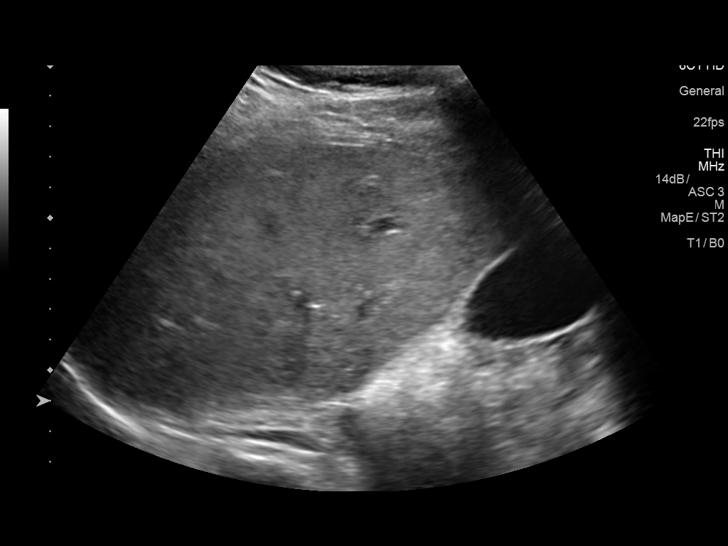
[im 39/39]
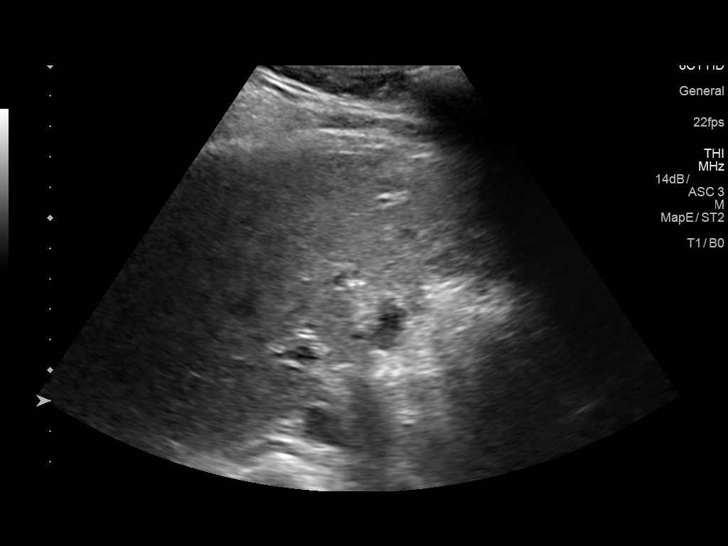

[14 of 25 positions shown; findings below may reference images not displayed]

FINDINGS: Gallbladder:

Normally distended without stones or wall thickening. No
pericholecystic fluid or sonographic Murphy sign.

Common bile duct:

Diameter: Normal caliber 3 mm diameter

Liver:

Normal appearance. No mass nodularity. Portal vein is patent on
color Doppler imaging with normal direction of blood flow towards
the liver.

No RIGHT upper quadrant free fluid
IMPRESSION: Normal RIGHT upper quadrant ultrasound.

## 2019-06-01 ENCOUNTER — Other Ambulatory Visit: Payer: Self-pay | Admitting: Family Medicine

## 2019-06-03 ENCOUNTER — Other Ambulatory Visit: Payer: Self-pay

## 2019-06-03 DIAGNOSIS — Z20822 Contact with and (suspected) exposure to covid-19: Secondary | ICD-10-CM

## 2019-06-04 LAB — NOVEL CORONAVIRUS, NAA: SARS-CoV-2, NAA: NOT DETECTED

## 2019-06-16 ENCOUNTER — Other Ambulatory Visit: Payer: Self-pay

## 2019-06-17 ENCOUNTER — Ambulatory Visit (INDEPENDENT_AMBULATORY_CARE_PROVIDER_SITE_OTHER): Payer: BC Managed Care – PPO | Admitting: Family Medicine

## 2019-06-17 ENCOUNTER — Encounter: Payer: Self-pay | Admitting: Family Medicine

## 2019-06-17 VITALS — BP 137/66 | HR 63 | Temp 97.3°F | Resp 20 | Ht 69.0 in | Wt 195.0 lb

## 2019-06-17 DIAGNOSIS — B351 Tinea unguium: Secondary | ICD-10-CM

## 2019-06-17 DIAGNOSIS — Z0001 Encounter for general adult medical examination with abnormal findings: Secondary | ICD-10-CM

## 2019-06-17 DIAGNOSIS — Z23 Encounter for immunization: Secondary | ICD-10-CM | POA: Diagnosis not present

## 2019-06-17 DIAGNOSIS — B181 Chronic viral hepatitis B without delta-agent: Secondary | ICD-10-CM

## 2019-06-17 DIAGNOSIS — Z0000 Encounter for general adult medical examination without abnormal findings: Secondary | ICD-10-CM

## 2019-06-17 DIAGNOSIS — E559 Vitamin D deficiency, unspecified: Secondary | ICD-10-CM

## 2019-06-17 DIAGNOSIS — E785 Hyperlipidemia, unspecified: Secondary | ICD-10-CM

## 2019-06-17 DIAGNOSIS — Z125 Encounter for screening for malignant neoplasm of prostate: Secondary | ICD-10-CM

## 2019-06-17 DIAGNOSIS — I10 Essential (primary) hypertension: Secondary | ICD-10-CM

## 2019-06-17 LAB — URINALYSIS
Bilirubin, UA: NEGATIVE
Glucose, UA: NEGATIVE
Ketones, UA: NEGATIVE
Leukocytes,UA: NEGATIVE
Nitrite, UA: NEGATIVE
Protein,UA: NEGATIVE
RBC, UA: NEGATIVE
Specific Gravity, UA: 1.02 (ref 1.005–1.030)
Urobilinogen, Ur: 0.2 mg/dL (ref 0.2–1.0)
pH, UA: 5.5 (ref 5.0–7.5)

## 2019-06-17 MED ORDER — AMLODIPINE BESYLATE 5 MG PO TABS: ORAL_TABLET | ORAL | 3 refills | Status: DC

## 2019-06-17 MED ORDER — CICLOPIROX 8 % EX SOLN: Freq: Every day | CUTANEOUS | 5 refills | Status: DC

## 2019-06-17 MED ORDER — TRIAMCINOLONE ACETONIDE 0.1 % EX CREA: 1.0000 "application " | TOPICAL_CREAM | Freq: Two times a day (BID) | CUTANEOUS | 3 refills | Status: DC

## 2019-06-17 NOTE — Progress Notes (Signed)
Subjective:  Patient ID: [name] [name], male    DOB: [birth year]  Age: 58 y.o. MRN: [id number]  CC: Annual Exam (CPE )   HPI [name] presents for annual physical. Seeing Hepatology for chronic Hepatitis B  presents for  follow-up of hypertension. Patient has no history of headache chest pain or shortness of breath or recent cough. Patient also denies symptoms of TIA such as focal numbness or weakness. Patient denies side effects from medication. States taking it regularly.   Depression screen [facility] 2/9 06/17/2019 09/11/2018 06/13/2018  Decreased Interest 0 0 0  Down, Depressed, Hopeless 0 0 0  PHQ - 2 Score 0 0 0  Altered sleeping - - -  Tired, decreased energy - - -  Change in appetite - - -  Feeling bad or failure about yourself  - - -  Trouble concentrating - - -  Moving slowly or fidgety/restless - - -  Suicidal thoughts - - -  PHQ-9 Score - - -    History Kristan has a past medical history of Hep B w/o coma, chronic, w/o delta ([place]), Hypertension, and Tattoos.   He has no past surgical history on file.   His family history includes Diabetes in his father and mother; Hypertension in his mother.He reports that he has never smoked. He has never used smokeless tobacco. He reports that he does not drink alcohol or use drugs.    ROS Review of Systems  Constitutional: Negative for activity change, fatigue and unexpected weight change.  HENT: Negative for congestion, ear pain, hearing loss, postnasal drip and trouble swallowing.   Eyes: Negative for pain and visual disturbance.  Respiratory: Negative for cough, chest tightness and shortness of breath.   Cardiovascular: Negative for chest pain, palpitations and leg swelling.  Gastrointestinal: Negative for abdominal distention, abdominal pain, blood in stool, constipation, diarrhea, nausea and vomiting.  Endocrine: Negative for cold intolerance, heat intolerance and polydipsia.  Genitourinary: Negative for  difficulty urinating, dysuria, flank pain, frequency and urgency.  Musculoskeletal: Negative for arthralgias and joint swelling.  Skin: Negative for color change, rash and wound.  Neurological: Negative for dizziness, syncope, speech difficulty, weakness, light-headedness, numbness and headaches.  Hematological: Does not bruise/bleed easily.  Psychiatric/Behavioral: Negative for confusion, decreased concentration, dysphoric mood and sleep disturbance. The patient is not nervous/anxious.     Objective:  BP 137/66   Pulse 63   Temp (!) 97.3 F (36.3 C)   Resp 20   Ht 5' 9" (1.753 m)   Wt 195 lb (88.5 kg)   SpO2 98%   BMI 28.80 kg/m   BP Readings from Last 3 Encounters:  06/17/19 137/66  09/11/18 (!) 150/77  06/13/18 133/67    Wt Readings from Last 3 Encounters:  06/17/19 195 lb (88.5 kg)  09/11/18 207 lb (93.9 kg)  06/13/18 205 lb (93 kg)     Physical Exam Constitutional:      Appearance: He is well-developed.  HENT:     Head: Normocephalic and atraumatic.  Eyes:     Pupils: Pupils are equal, round, and reactive to light.  Neck:     Musculoskeletal: Normal range of motion.     Thyroid: No thyromegaly.     Trachea: No tracheal deviation.  Cardiovascular:     Rate and Rhythm: Normal rate and regular rhythm.     Heart sounds: Normal heart sounds. No murmur. No friction rub. No gallop.   Pulmonary:     Breath sounds: Normal breath sounds.  No wheezing or rales.  Abdominal:     General: Bowel sounds are normal. There is no distension.     Palpations: Abdomen is soft. There is no mass.     Tenderness: There is no abdominal tenderness.     Hernia: There is no hernia in the left inguinal area.  Genitourinary:    Penis: Normal.      Scrotum/Testes: Normal.  Musculoskeletal: Normal range of motion.  Lymphadenopathy:     Cervical: No cervical adenopathy.  Skin:    General: Skin is warm and dry.     Findings: Lesion (onychomycosis all toes.) present.  Neurological:      Mental Status: He is alert and oriented to person, place, and time.       Assessment & Plan:   Mccabe was seen today for annual exam.  Diagnoses and all orders for this visit:  Well adult exam -     CBC with Differential/Platelet -     CMP14+EGFR -     Lipid panel -     VITAMIN D 25 Hydroxy (Vit-D Deficiency, Fractures) -     Urinalysis  Essential hypertension -     CMP14+EGFR  Onychomycosis of toenail -     CMP14+EGFR  Dyslipidemia -     Lipid panel  Chronic viral hepatitis B without delta agent and without coma (HCC)  Vitamin D deficiency -     VITAMIN D 25 Hydroxy (Vit-D Deficiency, Fractures)  Screening for prostate cancer -     PSA Total (Reflex To Free)  Need for immunization against influenza -     Flu Vaccine QUAD 36+ mos IM  Other orders -     amLODipine (NORVASC) 5 MG tablet; TAKE 1 AND 1/2 TABLET BY MOUTH DAILY -     triamcinolone cream (KENALOG) 0.1 %; Apply 1 application topically 2 (two) times daily. Avoid face and genitalia -     ciclopirox (PENLAC) 8 % solution; Apply topically at bedtime. Apply over nail and surrounding skin. Apply daily over previous coat. After seven (7) days, may remove with alcohol and continue cycle.       I have discontinued Toney Reil. Counihan's benzonatate and Vitamin D (Ergocalciferol). I am also having him start on ciclopirox. Additionally, I am having him maintain his Vemlidy, amLODipine, and triamcinolone cream.  Allergies as of 06/17/2019   No Known Allergies     Medication List       Accurate as of June 17, 2019 11:41 AM. If you have any questions, ask your nurse or doctor.        STOP taking these medications   benzonatate 100 MG capsule Commonly known as: Best boy Stopped by: Claretta Fraise, MD   Vitamin D (Ergocalciferol) 1.25 MG (50000 UT) Caps capsule Commonly known as: DRISDOL Stopped by: Claretta Fraise, MD     TAKE these medications   amLODipine 5 MG tablet Commonly known as:  NORVASC TAKE 1 AND 1/2 TABLET BY MOUTH DAILY   ciclopirox 8 % solution Commonly known as: Penlac Apply topically at bedtime. Apply over nail and surrounding skin. Apply daily over previous coat. After seven (7) days, may remove with alcohol and continue cycle. Started by: Claretta Fraise, MD   triamcinolone cream 0.1 % Commonly known as: KENALOG Apply 1 application topically 2 (two) times daily. Avoid face and genitalia   Vemlidy 25 MG Tabs Generic drug: Tenofovir Alafenamide Fumarate Take 1 tablet by mouth daily.        Follow-up: Return  in about 1 year (around 06/16/2020).  Claretta Fraise, M.D.

## 2019-06-18 ENCOUNTER — Other Ambulatory Visit: Payer: Self-pay | Admitting: Family Medicine

## 2019-06-18 LAB — CMP14+EGFR
ALT: 20 IU/L (ref 0–44)
AST: 23 IU/L (ref 0–40)
Albumin/Globulin Ratio: 1.6 (ref 1.2–2.2)
Albumin: 4.5 g/dL (ref 3.8–4.9)
Alkaline Phosphatase: 67 IU/L (ref 39–117)
BUN/Creatinine Ratio: 15 (ref 9–20)
BUN: 22 mg/dL (ref 6–24)
Bilirubin Total: 0.4 mg/dL (ref 0.0–1.2)
CO2: 26 mmol/L (ref 20–29)
Calcium: 9.8 mg/dL (ref 8.7–10.2)
Chloride: 102 mmol/L (ref 96–106)
Creatinine, Ser: 1.45 mg/dL — ABNORMAL HIGH (ref 0.76–1.27)
GFR calc Af Amer: 61 mL/min/{1.73_m2} (ref >59)
GFR calc non Af Amer: 52 mL/min/{1.73_m2} — ABNORMAL LOW (ref >59)
Globulin, Total: 2.8 g/dL (ref 1.5–4.5)
Glucose: 96 mg/dL (ref 65–99)
Potassium: 4.5 mmol/L (ref 3.5–5.2)
Sodium: 141 mmol/L (ref 134–144)
Total Protein: 7.3 g/dL (ref 6.0–8.5)

## 2019-06-18 LAB — CBC WITH DIFFERENTIAL/PLATELET
Basophils Absolute: 0 10*3/uL (ref 0.0–0.2)
Basos: 1 %
EOS (ABSOLUTE): 0.1 10*3/uL (ref 0.0–0.4)
Eos: 1 %
Hematocrit: 41.4 % (ref 37.5–51.0)
Hemoglobin: 14.5 g/dL (ref 13.0–17.7)
Immature Grans (Abs): 0 10*3/uL (ref 0.0–0.1)
Immature Granulocytes: 0 %
Lymphocytes Absolute: 2.4 10*3/uL (ref 0.7–3.1)
Lymphs: 42 %
MCH: 30.4 pg (ref 26.6–33.0)
MCHC: 35 g/dL (ref 31.5–35.7)
MCV: 87 fL (ref 79–97)
Monocytes Absolute: 0.4 10*3/uL (ref 0.1–0.9)
Monocytes: 8 %
Neutrophils Absolute: 2.8 10*3/uL (ref 1.4–7.0)
Neutrophils: 48 %
Platelets: 182 10*3/uL (ref 150–450)
RBC: 4.77 x10E6/uL (ref 4.14–5.80)
RDW: 12.8 % (ref 11.6–15.4)
WBC: 5.7 10*3/uL (ref 3.4–10.8)

## 2019-06-18 LAB — LIPID PANEL
Chol/HDL Ratio: 3.2 ratio (ref 0.0–5.0)
Cholesterol, Total: 159 mg/dL (ref 100–199)
HDL: 49 mg/dL (ref >39)
LDL Chol Calc (NIH): 98 mg/dL (ref 0–99)
Triglycerides: 57 mg/dL (ref 0–149)
VLDL Cholesterol Cal: 12 mg/dL (ref 5–40)

## 2019-06-18 LAB — PSA TOTAL (REFLEX TO FREE): Prostate Specific Ag, Serum: 0.9 ng/mL (ref 0.0–4.0)

## 2019-06-18 LAB — VITAMIN D 25 HYDROXY (VIT D DEFICIENCY, FRACTURES): Vit D, 25-Hydroxy: 24.4 ng/mL — ABNORMAL LOW (ref 30.0–100.0)

## 2019-06-18 MED ORDER — VITAMIN D (ERGOCALCIFEROL) 1.25 MG (50000 UNIT) PO CAPS: 50000.0000 [IU] | ORAL_CAPSULE | ORAL | 1 refills | Status: DC

## 2019-09-01 ENCOUNTER — Other Ambulatory Visit: Payer: Self-pay

## 2019-09-01 ENCOUNTER — Ambulatory Visit: Payer: BC Managed Care – PPO | Attending: Internal Medicine

## 2019-09-01 DIAGNOSIS — Z20822 Contact with and (suspected) exposure to covid-19: Secondary | ICD-10-CM

## 2019-09-02 LAB — NOVEL CORONAVIRUS, NAA: SARS-CoV-2, NAA: DETECTED — AB

## 2019-09-03 ENCOUNTER — Telehealth: Payer: Self-pay | Admitting: Nurse Practitioner

## 2019-09-03 NOTE — Telephone Encounter (Signed)
Called to Discuss with patient about Covid symptoms and the use of bamlanivimab, a monoclonal antibody infusion for those with mild to moderate Covid symptoms and at a high risk of hospitalization.     Pt is qualified for this infusion at the [facility] infusion center due to co-morbid conditions and/or a member of an at-risk group.     Patient Active Problem List   Diagnosis Date Noted  . Onychomycosis of toenail 06/13/2018  . Dyslipidemia 11/07/2013  . CKD (chronic kidney disease) stage 3, GFR 30-59 ml/min 08/15/2013  . Hypertension   . Tinea unguium 04/17/2013  . Hep B w/o coma, chronic, w/o delta (HCC)   . Tattoos     Patient declines infusion at this time. Symptoms tier reviewed as well as criteria for ending isolation. Preventative practices reviewed. Patient verbalized understanding.    Patient advised to call back if he decides that he does want to get infusion. Callback number to the infusion center given. Patient advised to go to Urgent care or ED with severe symptoms.

## 2019-10-30 ENCOUNTER — Ambulatory Visit: Payer: BC Managed Care – PPO

## 2019-10-31 ENCOUNTER — Ambulatory Visit: Payer: BC Managed Care – PPO | Attending: Internal Medicine

## 2019-10-31 DIAGNOSIS — Z23 Encounter for immunization: Secondary | ICD-10-CM

## 2019-10-31 NOTE — Progress Notes (Signed)
   Covid-19 Vaccination Clinic  Name:  [name] [name]    MRN: [id number] DOB: May 10, 1961  10/31/2019  [name] was observed post Covid-19 immunization for 15 minutes without incident. He was provided with Vaccine Information Sheet and instruction to access the V-Safe system.   [name] was instructed to call 911 with any severe reactions post vaccine: Marland Kitchen Difficulty breathing  . Swelling of face and throat  . A fast heartbeat  . A bad rash all over body  . Dizziness and weakness   Immunizations Administered    Name Date Dose VIS Date Route   Moderna COVID-19 Vaccine 10/31/2019  8:33 AM 0.5 mL 07/15/2019 Intramuscular   Manufacturer: Moderna   Lot: [id number]   [place]:[id number]

## 2019-11-07 ENCOUNTER — Other Ambulatory Visit: Payer: Self-pay | Admitting: Nurse Practitioner

## 2019-11-07 DIAGNOSIS — B181 Chronic viral hepatitis B without delta-agent: Secondary | ICD-10-CM

## 2019-11-11 ENCOUNTER — Ambulatory Visit
Admission: RE | Admit: 2019-11-11 | Discharge: 2019-11-11 | Disposition: A | Discharge status: Home / Self Care | Payer: BC Managed Care – PPO | Source: Ambulatory Visit | Attending: Nurse Practitioner | Admitting: Nurse Practitioner

## 2019-11-11 DIAGNOSIS — B181 Chronic viral hepatitis B without delta-agent: Secondary | ICD-10-CM

## 2019-11-21 ENCOUNTER — Other Ambulatory Visit: Payer: Self-pay | Admitting: Nurse Practitioner

## 2019-11-21 DIAGNOSIS — D376 Neoplasm of uncertain behavior of liver, gallbladder and bile ducts: Secondary | ICD-10-CM

## 2019-11-21 DIAGNOSIS — B181 Chronic viral hepatitis B without delta-agent: Secondary | ICD-10-CM

## 2019-12-03 ENCOUNTER — Ambulatory Visit: Payer: BC Managed Care – PPO | Attending: Internal Medicine

## 2019-12-03 DIAGNOSIS — Z23 Encounter for immunization: Secondary | ICD-10-CM

## 2019-12-03 NOTE — Progress Notes (Signed)
   Covid-19 Vaccination Clinic  Name:  [name] [name]    MRN: [id number] DOB: [birth year]  12/03/2019  Mr. [name] was observed post Covid-19 immunization for 15 minutes without incident. He was provided with Vaccine Information Sheet and instruction to access the V-Safe system.   Mr. [name] was instructed to call 911 with any severe reactions post vaccine: Marland Kitchen Difficulty breathing  . Swelling of face and throat  . A fast heartbeat  . A bad rash all over body  . Dizziness and weakness   Immunizations Administered    Name Date Dose VIS Date Route   Moderna COVID-19 Vaccine 12/03/2019  8:34 AM 0.5 mL 07/2019 Intramuscular   Manufacturer: Moderna   Lot: [id number]   [place][id number]

## 2019-12-15 ENCOUNTER — Other Ambulatory Visit: Payer: Self-pay | Admitting: *Deleted

## 2019-12-15 MED ORDER — AMLODIPINE BESYLATE 5 MG PO TABS: ORAL_TABLET | ORAL | 1 refills | Status: DC

## 2019-12-18 ENCOUNTER — Other Ambulatory Visit: Payer: Self-pay

## 2019-12-18 ENCOUNTER — Ambulatory Visit
Admission: RE | Admit: 2019-12-18 | Discharge: 2019-12-18 | Disposition: A | Discharge status: Home / Self Care | Payer: BC Managed Care – PPO | Source: Ambulatory Visit | Attending: Nurse Practitioner | Admitting: Nurse Practitioner

## 2019-12-18 DIAGNOSIS — D376 Neoplasm of uncertain behavior of liver, gallbladder and bile ducts: Secondary | ICD-10-CM

## 2019-12-18 DIAGNOSIS — B181 Chronic viral hepatitis B without delta-agent: Secondary | ICD-10-CM

## 2019-12-18 MED ORDER — GADOXETATE DISODIUM 0.25 MMOL/ML IV SOLN
10.0000 mL | Freq: Once | INTRAVENOUS | Status: AC | PRN
  Administered 2019-12-18: 10 mL via INTRAVENOUS

## 2020-05-25 ENCOUNTER — Other Ambulatory Visit: Payer: Self-pay | Admitting: Nurse Practitioner

## 2020-05-25 DIAGNOSIS — B1910 Unspecified viral hepatitis B without hepatic coma: Secondary | ICD-10-CM

## 2020-06-07 ENCOUNTER — Ambulatory Visit
Admission: RE | Admit: 2020-06-07 | Discharge: 2020-06-07 | Disposition: A | Discharge status: Home / Self Care | Payer: BC Managed Care – PPO | Source: Ambulatory Visit | Attending: Nurse Practitioner | Admitting: Nurse Practitioner

## 2020-06-07 ENCOUNTER — Other Ambulatory Visit: Payer: Self-pay | Admitting: Family Medicine

## 2020-06-07 DIAGNOSIS — B1910 Unspecified viral hepatitis B without hepatic coma: Secondary | ICD-10-CM

## 2020-06-17 ENCOUNTER — Ambulatory Visit (INDEPENDENT_AMBULATORY_CARE_PROVIDER_SITE_OTHER): Payer: BC Managed Care – PPO | Admitting: Family Medicine

## 2020-06-17 ENCOUNTER — Other Ambulatory Visit: Payer: Self-pay

## 2020-06-17 ENCOUNTER — Encounter: Payer: Self-pay | Admitting: Family Medicine

## 2020-06-17 VITALS — BP 162/88 | HR 62 | Temp 96.7°F | Resp 20 | Ht 69.0 in | Wt 214.0 lb

## 2020-06-17 DIAGNOSIS — N1831 Chronic kidney disease, stage 3a: Secondary | ICD-10-CM

## 2020-06-17 DIAGNOSIS — I10 Essential (primary) hypertension: Secondary | ICD-10-CM | POA: Diagnosis not present

## 2020-06-17 DIAGNOSIS — Z0001 Encounter for general adult medical examination with abnormal findings: Secondary | ICD-10-CM | POA: Diagnosis not present

## 2020-06-17 DIAGNOSIS — E785 Hyperlipidemia, unspecified: Secondary | ICD-10-CM

## 2020-06-17 DIAGNOSIS — Z0000 Encounter for general adult medical examination without abnormal findings: Secondary | ICD-10-CM

## 2020-06-17 DIAGNOSIS — Z23 Encounter for immunization: Secondary | ICD-10-CM | POA: Diagnosis not present

## 2020-06-17 DIAGNOSIS — Z1211 Encounter for screening for malignant neoplasm of colon: Secondary | ICD-10-CM

## 2020-06-17 DIAGNOSIS — E559 Vitamin D deficiency, unspecified: Secondary | ICD-10-CM

## 2020-06-17 DIAGNOSIS — Z125 Encounter for screening for malignant neoplasm of prostate: Secondary | ICD-10-CM

## 2020-06-17 DIAGNOSIS — Z1159 Encounter for screening for other viral diseases: Secondary | ICD-10-CM

## 2020-06-17 LAB — URINALYSIS
Bilirubin, UA: NEGATIVE
Glucose, UA: NEGATIVE
Ketones, UA: NEGATIVE
Leukocytes,UA: NEGATIVE
Nitrite, UA: NEGATIVE
Protein,UA: NEGATIVE
Specific Gravity, UA: 1.02 (ref 1.005–1.030)
Urobilinogen, Ur: 0.2 mg/dL (ref 0.2–1.0)
pH, UA: 7 (ref 5.0–7.5)

## 2020-06-17 NOTE — Progress Notes (Signed)
Subjective:  Patient ID: [name] [name], male    DOB: Jan 15, 1961  Age: 59 y.o. MRN: [id number]  CC: Annual Exam   HPI [name] [name] presents for annual exam  Depression screen [facility] 2/9 06/17/2020 06/17/2019 09/11/2018  Decreased Interest 0 0 0  Down, Depressed, Hopeless 0 0 0  PHQ - 2 Score 0 0 0  Altered sleeping - - -  Tired, decreased energy - - -  Change in appetite - - -  Feeling bad or failure about yourself  - - -  Trouble concentrating - - -  Moving slowly or fidgety/restless - - -  Suicidal thoughts - - -  PHQ-9 Score - - -    History [name] [name] has a past medical history of Hep B w/o coma, chronic, w/o delta ([place]), Hypertension, and Tattoos.   [name] [name] has no past surgical history on file.   His family history includes Diabetes in his father and mother; Hypertension in his mother.[name] [name] reports that [name] [name] has never smoked. [name] [name] has never used smokeless tobacco. [name] [name] reports that [name] [name] does not drink alcohol and does not use drugs.    ROS Review of Systems  Constitutional: Negative for activity change, fatigue and unexpected weight change.  HENT: Negative for congestion, ear pain, hearing loss, postnasal drip and trouble swallowing.   Eyes: Negative for pain and visual disturbance.  Respiratory: Negative for cough, chest tightness and shortness of breath.   Cardiovascular: Negative for chest pain, palpitations and leg swelling.  Gastrointestinal: Negative for abdominal distention, abdominal pain, blood in stool, constipation, diarrhea, nausea and vomiting.  Endocrine: Negative for cold intolerance, heat intolerance and polydipsia.  Genitourinary: Negative for difficulty urinating, dysuria, flank pain, frequency and urgency.  Musculoskeletal: Negative for arthralgias and joint swelling.  Skin: Negative for color change, rash and wound.  Neurological: Negative for dizziness, syncope, speech difficulty, weakness, light-headedness, numbness and headaches.  Hematological: Does not  bruise/bleed easily.  Psychiatric/Behavioral: Negative for confusion, decreased concentration, dysphoric mood and sleep disturbance. The patient is not nervous/anxious.     Objective:  BP (!) 162/88   Pulse 62   Temp (!) 96.7 F (35.9 C) (Temporal)   Resp 20   Ht _0  (1.753 m)   Wt 214 lb (97.1 kg)   SpO2 97%   BMI 31.60 kg/m   BP Readings from Last 3 Encounters:  06/17/20 (!) 162/88  06/17/19 137/66  09/11/18 (!) 150/77    Wt Readings from Last 3 Encounters:  06/17/20 214 lb (97.1 kg)  06/17/19 195 lb (88.5 kg)  09/11/18 207 lb (93.9 kg)     Physical Exam Constitutional:      Appearance: [name] [name] is well-developed.  HENT:     Head: Normocephalic and atraumatic.  Eyes:     Pupils: Pupils are equal, round, and reactive to light.  Neck:     Thyroid: No thyromegaly.     Trachea: No tracheal deviation.  Cardiovascular:     Rate and Rhythm: Normal rate and regular rhythm.     Heart sounds: Normal heart sounds. No murmur heard.  No friction rub. No gallop.   Pulmonary:     Breath sounds: Normal breath sounds. No wheezing or rales.  Abdominal:     General: Bowel sounds are normal. There is no distension.     Palpations: Abdomen is soft. There is no mass.     Tenderness: There is no abdominal tenderness.     Hernia: There is no hernia in the left inguinal area.  Genitourinary:    Penis: Normal.      Testes: Normal.  Musculoskeletal:        General: Normal range of motion.     Cervical back: Normal range of motion.  Lymphadenopathy:     Cervical: No cervical adenopathy.  Skin:    General: Skin is warm and dry.  Neurological:     Mental Status: [name] [name] is alert and oriented to person, place, and time.       Assessment & Plan:   [name] [name] was seen today for annual exam.  Diagnoses and all orders for this visit:  Well adult exam -     CBC with Differential/Platelet -     CMP14+EGFR -     Hepatitis C antibody -     PSA Total (Reflex To Free) -     Lipid panel -      Urinalysis -     VITAMIN D 25 Hydroxy (Vit-D Deficiency, Fractures)  Need for immunization against influenza -     Flu Vaccine QUAD 36+ mos IM  Primary hypertension -     CBC with Differential/Platelet -     CMP14+EGFR -     Urinalysis  Stage 3a chronic kidney disease (HCC) -     CBC with Differential/Platelet -     CMP14+EGFR  Dyslipidemia -     CBC with Differential/Platelet -     CMP14+EGFR -     Lipid panel  Vitamin D deficiency -     VITAMIN D 25 Hydroxy (Vit-D Deficiency, Fractures)  Screening for prostate cancer -     PSA Total (Reflex To Free)  Screen for colon cancer -     Ambulatory referral to Gastroenterology  Need for hepatitis C screening test -     Hepatitis C antibody       I am having Toney Reil. Aloisi maintain his Vemlidy, triamcinolone cream, ciclopirox, Vitamin D (Ergocalciferol), and amLODipine.  Allergies as of 06/17/2020   No Known Allergies     Medication List       Accurate as of June 17, 2020 11:59 PM. If you have any questions, ask your nurse or doctor.        amLODipine 5 MG tablet Commonly known as: NORVASC TAKE ONE AND ONE-HALF TABLETS DAILY   ciclopirox 8 % solution Commonly known as: Penlac Apply topically at bedtime. Apply over nail and surrounding skin. Apply daily over previous coat. After seven (7) days, may remove with alcohol and continue cycle.   triamcinolone cream 0.1 % Commonly known as: KENALOG Apply 1 application topically 2 (two) times daily. Avoid face and genitalia   Vemlidy 25 MG Tabs Generic drug: Tenofovir Alafenamide Fumarate Take 1 tablet by mouth daily.   Vitamin D (Ergocalciferol) 1.25 MG (50000 UNIT) Caps capsule Commonly known as: DRISDOL Take 1 capsule (50,000 Units total) by mouth every 7 (seven) days.        Follow-up: Return in about 6 months (around 12/15/2020).  Claretta Fraise, M.D.

## 2020-06-18 ENCOUNTER — Telehealth: Payer: Self-pay

## 2020-06-18 LAB — CMP14+EGFR
ALT: 25 IU/L (ref 0–44)
AST: 22 IU/L (ref 0–40)
Albumin/Globulin Ratio: 1.8 (ref 1.2–2.2)
Albumin: 4.8 g/dL (ref 3.8–4.9)
Alkaline Phosphatase: 65 IU/L (ref 44–121)
BUN/Creatinine Ratio: 11 (ref 10–24)
BUN: 15 mg/dL (ref 8–27)
Bilirubin Total: 0.4 mg/dL (ref 0.0–1.2)
CO2: 25 mmol/L (ref 20–29)
Calcium: 10 mg/dL (ref 8.6–10.2)
Chloride: 103 mmol/L (ref 96–106)
Creatinine, Ser: 1.32 mg/dL — ABNORMAL HIGH (ref 0.76–1.27)
GFR calc Af Amer: 67 mL/min/{1.73_m2} (ref >59)
GFR calc non Af Amer: 58 mL/min/{1.73_m2} — ABNORMAL LOW (ref >59)
Globulin, Total: 2.6 g/dL (ref 1.5–4.5)
Glucose: 104 mg/dL — ABNORMAL HIGH (ref 65–99)
Potassium: 4.6 mmol/L (ref 3.5–5.2)
Sodium: 144 mmol/L (ref 134–144)
Total Protein: 7.4 g/dL (ref 6.0–8.5)

## 2020-06-18 LAB — PSA TOTAL (REFLEX TO FREE): Prostate Specific Ag, Serum: 0.6 ng/mL (ref 0.0–4.0)

## 2020-06-18 LAB — CBC WITH DIFFERENTIAL/PLATELET
Basophils Absolute: 0 10*3/uL (ref 0.0–0.2)
Basos: 0 %
EOS (ABSOLUTE): 0.2 10*3/uL (ref 0.0–0.4)
Eos: 3 %
Hematocrit: 44.5 % (ref 37.5–51.0)
Hemoglobin: 15.4 g/dL (ref 13.0–17.7)
Immature Grans (Abs): 0 10*3/uL (ref 0.0–0.1)
Immature Granulocytes: 0 %
Lymphocytes Absolute: 2.8 10*3/uL (ref 0.7–3.1)
Lymphs: 41 %
MCH: 30.6 pg (ref 26.6–33.0)
MCHC: 34.6 g/dL (ref 31.5–35.7)
MCV: 88 fL (ref 79–97)
Monocytes Absolute: 0.5 10*3/uL (ref 0.1–0.9)
Monocytes: 8 %
Neutrophils Absolute: 3.3 10*3/uL (ref 1.4–7.0)
Neutrophils: 48 %
Platelets: 191 10*3/uL (ref 150–450)
RBC: 5.04 x10E6/uL (ref 4.14–5.80)
RDW: 12.3 % (ref 11.6–15.4)
WBC: 6.8 10*3/uL (ref 3.4–10.8)

## 2020-06-18 LAB — LIPID PANEL
Chol/HDL Ratio: 4.5 ratio (ref 0.0–5.0)
Cholesterol, Total: 193 mg/dL (ref 100–199)
HDL: 43 mg/dL (ref >39)
LDL Chol Calc (NIH): 137 mg/dL — ABNORMAL HIGH (ref 0–99)
Triglycerides: 73 mg/dL (ref 0–149)
VLDL Cholesterol Cal: 13 mg/dL (ref 5–40)

## 2020-06-18 LAB — HEPATITIS C ANTIBODY: Hep C Virus Ab: <0.1 s/co ratio (ref 0.0–0.9)

## 2020-06-18 LAB — VITAMIN D 25 HYDROXY (VIT D DEFICIENCY, FRACTURES): Vit D, 25-Hydroxy: 18.2 ng/mL — ABNORMAL LOW (ref 30.0–100.0)

## 2020-06-18 NOTE — Telephone Encounter (Signed)
Pt called - he is aware that final report on labs from Dr Livia Snellen is not back - labs were semi-reviewed with pt.

## 2020-06-19 ENCOUNTER — Encounter: Payer: Self-pay | Admitting: Family Medicine

## 2020-06-28 ENCOUNTER — Other Ambulatory Visit: Payer: Self-pay | Admitting: Family Medicine

## 2020-09-06 ENCOUNTER — Other Ambulatory Visit: Payer: Self-pay | Admitting: Family Medicine

## 2020-09-10 ENCOUNTER — Ambulatory Visit (AMBULATORY_SURGERY_CENTER): Payer: Self-pay | Admitting: *Deleted

## 2020-09-10 ENCOUNTER — Other Ambulatory Visit: Payer: Self-pay

## 2020-09-10 VITALS — Ht 69.0 in | Wt 219.0 lb

## 2020-09-10 DIAGNOSIS — Z1211 Encounter for screening for malignant neoplasm of colon: Secondary | ICD-10-CM

## 2020-09-10 NOTE — Progress Notes (Signed)
Patient is here in-person for PV. Patient denies any allergies to eggs or soy. Patient denies any problems with sedation. Patient denies any oxygen use at home. Patient denies taking any diet/weight loss medications or blood thinners. Patient is not being treated for MRSA or C-diff. Patient is aware of our care-partner policy and ZOXWR-60 safety protocol. EMMI education assigned to the patient for the procedure, sent to [place].   COVID-19 vaccines completed on 08/27/2020 x3, per patient.

## 2020-09-22 ENCOUNTER — Encounter: Payer: Self-pay | Admitting: Gastroenterology

## 2020-09-24 ENCOUNTER — Ambulatory Visit (AMBULATORY_SURGERY_CENTER): Payer: BC Managed Care – PPO | Admitting: Gastroenterology

## 2020-09-24 ENCOUNTER — Encounter: Payer: Self-pay | Admitting: Gastroenterology

## 2020-09-24 ENCOUNTER — Other Ambulatory Visit: Payer: Self-pay

## 2020-09-24 VITALS — BP 113/61 | HR 64 | Temp 97.3°F | Resp 17 | Ht 69.0 in | Wt 219.0 lb

## 2020-09-24 DIAGNOSIS — D123 Benign neoplasm of transverse colon: Secondary | ICD-10-CM

## 2020-09-24 DIAGNOSIS — D124 Benign neoplasm of descending colon: Secondary | ICD-10-CM

## 2020-09-24 DIAGNOSIS — Z1211 Encounter for screening for malignant neoplasm of colon: Secondary | ICD-10-CM | POA: Diagnosis present

## 2020-09-24 MED ORDER — SODIUM CHLORIDE 0.9 % IV SOLN: 500.0000 mL | Freq: Once | INTRAVENOUS | Status: DC

## 2020-09-24 NOTE — Op Note (Addendum)
[facility] Patient Name: [name] [name] Procedure Date: 09/24/2020 11:48 AM MRN: [id number] Endoscopist: Justice Britain , MD Age: 60 Referring MD:  Date of Birth: 1960/10/07 Gender: Male Account #: [id number] Procedure:                Colonoscopy Indications:              Screening for colorectal malignant neoplasm Medicines:                Monitored Anesthesia Care Procedure:                Pre-Anesthesia Assessment:                           - Prior to the procedure, a History and Physical                            was performed, and patient medications and                            allergies were reviewed. The patient's tolerance of                            previous anesthesia was also reviewed. The risks                            and benefits of the procedure and the sedation                            options and risks were discussed with the patient.                            All questions were answered, and informed consent                            was obtained. Prior Anticoagulants: The patient has                            taken no previous anticoagulant or antiplatelet                            agents. ASA Grade Assessment: II - A patient with                            mild systemic disease. After reviewing the risks                            and benefits, the patient was deemed in                            satisfactory condition to undergo the procedure.                           After obtaining informed consent, the colonoscope  was passed under direct vision. Throughout the                            procedure, the patient's blood pressure, pulse, and                            oxygen saturations were monitored continuously. The                            Olympus CF-HQ190 [phone]) Colonoscope was                            introduced through the anus and advanced to the 5                            cm into the  ileum. The colonoscopy was performed                            without difficulty. The patient tolerated the                            procedure. The quality of the bowel preparation was                            adequate. The terminal ileum, ileocecal valve,                            appendiceal orifice, and rectum were photographed. Scope In: 11:58:05 AM Scope Out: 12:11:41 PM Scope Withdrawal Time: 0 hours 11 minutes 10 seconds  Total Procedure Duration: 0 hours 13 minutes 36 seconds  Findings:                 The digital rectal exam findings include                            hemorrhoids. Pertinent negatives include no                            palpable rectal lesions.                           The terminal ileum and ileocecal valve appeared                            normal.                           Diverticulosis noted in the right colon.                           Three sessile polyps were found in the descending                            colon (1) and transverse colon (2). The polyps were  2 to 5 mm in size. These polyps were removed with a                            cold snare. Resection and retrieval were complete.                           Normal mucosa was found in the entire colon                            otherwise.                           Non-bleeding non-thrombosed internal hemorrhoids                            were found during retroflexion, during perianal                            exam and during digital exam. The hemorrhoids were                            Grade II (internal hemorrhoids that prolapse but                            reduce spontaneously). Complications:            No immediate complications. Estimated Blood Loss:     Estimated blood loss was minimal. Impression:               - Hemorrhoids found on digital rectal exam.                           - The examined portion of the ileum was normal.                            - Diverticulosis noted in right colon.                           - Three 2 to 5 mm polyps in the descending colon                            and in the transverse colon, removed with a cold                            snare. Resected and retrieved.                           - Normal mucosa in the entire examined colon                            otherwise.                           - Non-bleeding non-thrombosed internal hemorrhoids. Recommendation:           - The patient will be observed  post-procedure,                            until all discharge criteria are met.                           - Discharge patient to home.                           - Patient has a contact number available for                            emergencies. The signs and symptoms of potential                            delayed complications were discussed with the                            patient. Return to normal activities tomorrow.                            Written discharge instructions were provided to the                            patient.                           - High fiber diet.                           - Consider fiber supplement.                           - Proceed to scheduled colonoscopy.                           - Continue present medications.                           - Await pathology results.                           - Repeat colonoscopy in 3/5/7 years for                            surveillance based on pathology results.                           - The findings and recommendations were discussed                            with the patient.                           - The findings and recommendations were discussed  with the designated responsible adult. Justice Britain, MD 09/24/2020 12:16:37 PM

## 2020-09-24 NOTE — Progress Notes (Signed)
Report given to PACU, vss 

## 2020-09-24 NOTE — Patient Instructions (Signed)
Discharge instructions given. Handouts on polyps and hemorrhoids. Resume previous medications. YOU HAD AN ENDOSCOPIC PROCEDURE TODAY AT THE [facility]:   Refer to the procedure report that was given to you for any specific questions about what was found during the examination.  If the procedure report does not answer your questions, please call your gastroenterologist to clarify.  If you requested that your care partner not be given the details of your procedure findings, then the procedure report has been included in a sealed envelope for you to review at your convenience later.  YOU SHOULD EXPECT: Some feelings of bloating in the abdomen. Passage of more gas than usual.  Walking can help get rid of the air that was put into your GI tract during the procedure and reduce the bloating. If you had a lower endoscopy (such as a colonoscopy or flexible sigmoidoscopy) you may notice spotting of blood in your stool or on the toilet paper. If you underwent a bowel prep for your procedure, you may not have a normal bowel movement for a few days.  Please Note:  You might notice some irritation and congestion in your nose or some drainage.  This is from the oxygen used during your procedure.  There is no need for concern and it should clear up in a day or so.  SYMPTOMS TO REPORT IMMEDIATELY:  Following lower endoscopy (colonoscopy or flexible sigmoidoscopy):  Excessive amounts of blood in the stool  Significant tenderness or worsening of abdominal pains  Swelling of the abdomen that is new, acute  Fever of 100F or higher   For urgent or emergent issues, a gastroenterologist can be reached at any hour by calling [phone]. Do not use MyChart messaging for urgent concerns.    DIET:  We do recommend a small meal at first, but then you may proceed to your regular diet.  Drink plenty of fluids but you should avoid alcoholic beverages for 24 hours.  ACTIVITY:  You should plan to take it  easy for the rest of today and you should NOT DRIVE or use heavy machinery until tomorrow (because of the sedation medicines used during the test).    FOLLOW UP: Our staff will call the number listed on your records 48-72 hours following your procedure to check on you and address any questions or concerns that you may have regarding the information given to you following your procedure. If we do not reach you, we will leave a message.  We will attempt to reach you two times.  During this call, we will ask if you have developed any symptoms of COVID 19. If you develop any symptoms (ie: fever, flu-like symptoms, shortness of breath, cough etc.) before then, please call [phone].  If you test positive for Covid 19 in the 2 weeks post procedure, please call and report this information to us.    If any biopsies were taken you will be contacted by phone or by letter within the next 1-3 weeks.  Please call us at [phone] if you have not heard about the biopsies in 3 weeks.    SIGNATURES/CONFIDENTIALITY: You and/or your care partner have signed paperwork which will be entered into your electronic medical record.  These signatures attest to the fact that that the information above on your After Visit Summary has been reviewed and is understood.  Full responsibility of the confidentiality of this discharge information lies with you and/or your care-partner.  

## 2020-09-24 NOTE — Progress Notes (Signed)
Medical history reviewed with no changes noted. VS assessed by C.W 

## 2020-09-24 NOTE — Progress Notes (Signed)
Called to room to assist during endoscopic procedure.  Patient ID and intended procedure confirmed with present staff. Received instructions for my participation in the procedure from the performing physician.  

## 2020-09-28 ENCOUNTER — Telehealth: Payer: Self-pay

## 2020-09-28 ENCOUNTER — Telehealth: Payer: Self-pay | Admitting: *Deleted

## 2020-09-28 NOTE — Telephone Encounter (Signed)
  Follow up Call-  Call back number 09/24/2020  Post procedure Call Back phone  # [phone]  Permission to leave phone message Yes  Some recent data might be hidden    LMOM to call back with any questions or concerns.  Also, call back if patient has developed fever, respiratory issues or been dx with COVID or had any family members or close contacts diagnosed since her procedure.

## 2020-09-28 NOTE — Telephone Encounter (Signed)
Second attempt follow up call to pt lm on vm.

## 2020-09-28 NOTE — Telephone Encounter (Signed)
First attempt, left VM.  

## 2020-09-29 ENCOUNTER — Encounter: Payer: Self-pay | Admitting: Gastroenterology

## 2020-11-30 ENCOUNTER — Other Ambulatory Visit: Payer: Self-pay | Admitting: Nurse Practitioner

## 2020-11-30 DIAGNOSIS — D376 Neoplasm of uncertain behavior of liver, gallbladder and bile ducts: Secondary | ICD-10-CM

## 2020-11-30 DIAGNOSIS — B181 Chronic viral hepatitis B without delta-agent: Secondary | ICD-10-CM

## 2020-11-30 DIAGNOSIS — K7402 Hepatic fibrosis, advanced fibrosis: Secondary | ICD-10-CM

## 2020-12-06 ENCOUNTER — Other Ambulatory Visit: Payer: Self-pay | Admitting: Family Medicine

## 2020-12-15 ENCOUNTER — Encounter: Payer: Self-pay | Admitting: Family Medicine

## 2020-12-15 ENCOUNTER — Ambulatory Visit: Payer: BC Managed Care – PPO | Admitting: Family Medicine

## 2020-12-15 ENCOUNTER — Other Ambulatory Visit: Payer: Self-pay

## 2020-12-15 VITALS — BP 134/64 | HR 66 | Temp 97.2°F | Ht 69.0 in | Wt 229.4 lb

## 2020-12-15 DIAGNOSIS — I10 Essential (primary) hypertension: Secondary | ICD-10-CM

## 2020-12-15 DIAGNOSIS — N1831 Chronic kidney disease, stage 3a: Secondary | ICD-10-CM

## 2020-12-15 DIAGNOSIS — E785 Hyperlipidemia, unspecified: Secondary | ICD-10-CM | POA: Diagnosis not present

## 2020-12-15 MED ORDER — AMLODIPINE BESYLATE 10 MG PO TABS
10.0000 mg | ORAL_TABLET | Freq: Every day | ORAL | 1 refills | Status: DC
Start: 2020-12-15 — End: 2021-06-20

## 2020-12-15 MED ORDER — CHLORTHALIDONE 25 MG PO TABS: 25.0000 mg | ORAL_TABLET | Freq: Every day | ORAL | 1 refills | Status: DC

## 2020-12-15 NOTE — Progress Notes (Signed)
Subjective:  Patient ID: [name] [name], male    DOB: 04/23/1961  Age: 60 y.o. MRN: [id number]  CC: Follow-up   HPI [name] [name] presents for  follow-up of hypertension. Patient has no history of headache chest pain or shortness of breath or recent cough. Patient also denies symptoms of TIA such as focal numbness or weakness. Patient denies side effects from medication. States taking it regularly.Bp was hign at liver checkup 2 weeks ago so he increased the amlo to 3 pills a day.    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   He has a past surgical history that includes Colonoscopy (05/25/2010).   His family history includes Diabetes in his father and mother; Hypertension in his mother.He reports that he has never smoked. He has never used smokeless tobacco. He reports that he does not drink alcohol and does not use drugs.  Current Outpatient Medications on File Prior to Visit  Medication Sig Dispense Refill  . amLODipine (NORVASC) 5 MG tablet TAKE ONE AND ONE-HALF TABLETS DAILY 135 tablet 0  . ciclopirox (PENLAC) 8 % solution Apply topically at bedtime. Apply over nail and surrounding skin. Apply daily over previous coat. After seven (7) days, may remove with alcohol and continue cycle. 6.6 mL 5  . Multiple Vitamin (MULTIVITAMIN) tablet Take 1 tablet by mouth daily.    Marland Kitchen triamcinolone cream (KENALOG) 0.1 % Apply 1 application topically 2 (two) times daily. Avoid face and genitalia 453 g 3  . VEMLIDY 25 MG TABS Take 1 tablet by mouth daily.    . Vitamin D, Ergocalciferol, (DRISDOL) 1.25 MG (50000 UNIT) CAPS capsule TAKE 1 CAPSULE (50,000 UNITS TOTAL) BY MOUTH EVERY 7 (SEVEN) DAYS. 13 capsule 1   No current facility-administered medications on file prior to visit.    ROS Review of Systems  Constitutional: Negative for fever.  Respiratory: Negative for shortness of breath.    Cardiovascular: Negative for chest pain.  Musculoskeletal: Negative for arthralgias.  Skin: Negative for rash.    Objective:  BP 134/64   Pulse 66   Temp (!) 97.2 F (36.2 C)   Ht 5\' 9"  (1.753 m)   Wt 229 lb 6.4 oz (104.1 kg)   SpO2 95%   BMI 33.88 kg/m   BP Readings from Last 3 Encounters:  12/15/20 134/64  09/24/20 113/61  06/17/20 (!) 162/88    Wt Readings from Last 3 Encounters:  12/15/20 229 lb 6.4 oz (104.1 kg)  09/24/20 219 lb (99.3 kg)  09/10/20 219 lb (99.3 kg)     Physical Exam Vitals reviewed.  Constitutional:      Appearance: He is well-developed.  HENT:     Head: Normocephalic and atraumatic.     Right Ear: Tympanic membrane and external ear normal. No decreased hearing noted.     Left Ear: Tympanic membrane and external ear normal. No decreased hearing noted.     Mouth/Throat:     Pharynx: No oropharyngeal exudate or posterior oropharyngeal erythema.  Eyes:     Pupils: Pupils are equal, round, and reactive to light.  Cardiovascular:     Rate and Rhythm: Normal rate and regular rhythm.     Heart sounds: No murmur heard.   Pulmonary:     Effort: No respiratory distress.     Breath sounds: Normal breath sounds.  Abdominal:     General: Bowel sounds are normal.  Palpations: Abdomen is soft. There is no mass.     Tenderness: There is no abdominal tenderness.  Musculoskeletal:     Cervical back: Normal range of motion and neck supple.       Assessment & Plan:   [name] [name] was seen today for follow-up.  Diagnoses and all orders for this visit:  Primary hypertension -     amLODipine (NORVASC) 10 MG tablet; Take 1 tablet (10 mg total) by mouth daily. -     chlorthalidone (HYGROTON) 25 MG tablet; Take 1 tablet (25 mg total) by mouth daily.  Dyslipidemia  Stage 3a chronic kidney disease (HCC) -     chlorthalidone (HYGROTON) 25 MG tablet; Take 1 tablet (25 mg total) by mouth daily.   Allergies as of 12/15/2020   No Known Allergies      Medication List       Accurate as of Dec 15, 2020 11:59 PM. If you have any questions, ask your nurse or doctor.        amLODipine 5 MG tablet Commonly known as: NORVASC TAKE ONE AND ONE-HALF TABLETS DAILY What changed: Another medication with the same name was added. Make sure you understand how and when to take each. Changed by: Claretta Fraise, MD   amLODipine 10 MG tablet Commonly known as: NORVASC Take 1 tablet (10 mg total) by mouth daily. What changed: You were already taking a medication with the same name, and this prescription was added. Make sure you understand how and when to take each. Changed by: Claretta Fraise, MD   chlorthalidone 25 MG tablet Commonly known as: HYGROTON Take 1 tablet (25 mg total) by mouth daily. Started by: Claretta Fraise, MD   ciclopirox 8 % solution Commonly known as: Penlac Apply topically at bedtime. Apply over nail and surrounding skin. Apply daily over previous coat. After seven (7) days, may remove with alcohol and continue cycle.   multivitamin tablet Take 1 tablet by mouth daily.   triamcinolone cream 0.1 % Commonly known as: KENALOG Apply 1 application topically 2 (two) times daily. Avoid face and genitalia   Vemlidy 25 MG Tabs Generic drug: Tenofovir Alafenamide Fumarate Take 1 tablet by mouth daily.   Vitamin D (Ergocalciferol) 1.25 MG (50000 UNIT) Caps capsule Commonly known as: DRISDOL TAKE 1 CAPSULE (50,000 UNITS TOTAL) BY MOUTH EVERY 7 (SEVEN) DAYS.       Meds ordered this encounter  Medications  . amLODipine (NORVASC) 10 MG tablet    Sig: Take 1 tablet (10 mg total) by mouth daily.    Dispense:  90 tablet    Refill:  1    Pt. Has enough for a few weeks. Do not fill until he calls. Please kep on file.  . chlorthalidone (HYGROTON) 25 MG tablet    Sig: Take 1 tablet (25 mg total) by mouth daily.    Dispense:  90 tablet    Refill:  1       Follow-up: Return in about 6 months (around 06/17/2021).  Claretta Fraise,  M.D.

## 2021-01-12 ENCOUNTER — Ambulatory Visit
Admission: RE | Admit: 2021-01-12 | Discharge: 2021-01-12 | Disposition: A | Discharge status: Home / Self Care | Payer: BC Managed Care – PPO | Source: Ambulatory Visit | Attending: Nurse Practitioner | Admitting: Nurse Practitioner

## 2021-01-12 DIAGNOSIS — B181 Chronic viral hepatitis B without delta-agent: Secondary | ICD-10-CM

## 2021-01-12 DIAGNOSIS — K7402 Hepatic fibrosis, advanced fibrosis: Secondary | ICD-10-CM

## 2021-01-12 DIAGNOSIS — D376 Neoplasm of uncertain behavior of liver, gallbladder and bile ducts: Secondary | ICD-10-CM

## 2021-05-24 ENCOUNTER — Other Ambulatory Visit: Payer: Self-pay | Admitting: Family Medicine

## 2021-05-24 DIAGNOSIS — N1831 Chronic kidney disease, stage 3a: Secondary | ICD-10-CM

## 2021-05-24 DIAGNOSIS — I10 Essential (primary) hypertension: Secondary | ICD-10-CM

## 2021-06-20 ENCOUNTER — Ambulatory Visit (INDEPENDENT_AMBULATORY_CARE_PROVIDER_SITE_OTHER): Payer: BC Managed Care – PPO | Admitting: Family Medicine

## 2021-06-20 ENCOUNTER — Other Ambulatory Visit: Payer: Self-pay

## 2021-06-20 ENCOUNTER — Encounter: Payer: Self-pay | Admitting: Family Medicine

## 2021-06-20 VITALS — BP 125/70 | HR 66 | Temp 97.6°F | Ht 69.0 in | Wt 225.8 lb

## 2021-06-20 DIAGNOSIS — Z0001 Encounter for general adult medical examination with abnormal findings: Secondary | ICD-10-CM | POA: Diagnosis not present

## 2021-06-20 DIAGNOSIS — E559 Vitamin D deficiency, unspecified: Secondary | ICD-10-CM

## 2021-06-20 DIAGNOSIS — E785 Hyperlipidemia, unspecified: Secondary | ICD-10-CM

## 2021-06-20 DIAGNOSIS — I10 Essential (primary) hypertension: Secondary | ICD-10-CM | POA: Diagnosis not present

## 2021-06-20 DIAGNOSIS — N1831 Chronic kidney disease, stage 3a: Secondary | ICD-10-CM

## 2021-06-20 DIAGNOSIS — Z23 Encounter for immunization: Secondary | ICD-10-CM

## 2021-06-20 DIAGNOSIS — Z125 Encounter for screening for malignant neoplasm of prostate: Secondary | ICD-10-CM

## 2021-06-20 DIAGNOSIS — Z0000 Encounter for general adult medical examination without abnormal findings: Secondary | ICD-10-CM

## 2021-06-20 MED ORDER — CHLORTHALIDONE 25 MG PO TABS: 25.0000 mg | ORAL_TABLET | Freq: Every day | ORAL | 3 refills | Status: DC

## 2021-06-20 MED ORDER — AMLODIPINE BESYLATE 10 MG PO TABS: 10.0000 mg | ORAL_TABLET | Freq: Every day | ORAL | 3 refills | Status: DC

## 2021-06-20 NOTE — Progress Notes (Signed)
Subjective:  Patient ID: [name] [name], male    DOB: [birth year]  Age: 60 y.o. MRN: [id number]  CC: Annual Exam   HPI [name] [name] presents for CPE.    presents for  follow-up of hypertension. Patient has no history of headache chest pain or shortness of breath or recent cough. Patient also denies symptoms of TIA such as focal numbness or weakness. Patient denies side effects from medication. States taking it regularly.   Depression screen [facility] 2/9 06/20/2021 06/20/2021 12/15/2020  Decreased Interest 0 0 0  Down, Depressed, Hopeless 0 0 0  PHQ - 2 Score 0 0 0  Altered sleeping 0 - -  Tired, decreased energy 1 - -  Change in appetite 0 - -  Feeling bad or failure about yourself  0 - -  Trouble concentrating 0 - -  Moving slowly or fidgety/restless 0 - -  Suicidal thoughts 0 - -  PHQ-9 Score 1 - -  Difficult doing work/chores Not difficult at all - -    History [name] [name] has a past medical history of Chronic kidney disease, Hep B w/o coma, chronic, w/o delta ([place]), Hypertension, and Tattoos.   He has a past surgical history that includes Colonoscopy (05/25/2010).   His family history includes Diabetes in his father and mother; Hypertension in his mother.He reports that he has never smoked. He has never used smokeless tobacco. He reports that he does not drink alcohol and does not use drugs.    ROS Review of Systems  Constitutional:  Negative for activity change, fatigue and unexpected weight change.  HENT:  Negative for congestion, ear pain, hearing loss, postnasal drip and trouble swallowing.   Eyes:  Negative for pain and visual disturbance.  Respiratory:  Negative for cough, chest tightness and shortness of breath.   Cardiovascular:  Negative for chest pain, palpitations and leg swelling.  Gastrointestinal:  Negative for abdominal distention, abdominal pain, blood in stool, constipation, diarrhea, nausea and vomiting.  Endocrine: Negative for cold intolerance, heat  intolerance and polydipsia.  Genitourinary:  Negative for difficulty urinating, dysuria, flank pain, frequency and urgency.  Musculoskeletal:  Negative for arthralgias and joint swelling.  Skin:  Negative for color change, rash and wound.  Neurological:  Negative for dizziness, syncope, speech difficulty, weakness, light-headedness, numbness and headaches.  Hematological:  Does not bruise/bleed easily.  Psychiatric/Behavioral:  Negative for confusion, decreased concentration, dysphoric mood and sleep disturbance. The patient is not nervous/anxious.    Objective:  BP 125/70   Pulse 66   Temp 97.6 F (36.4 C)   Ht _0  (1.753 m)   Wt 225 lb 12.8 oz (102.4 kg)   SpO2 96%   BMI 33.34 kg/m   BP Readings from Last 3 Encounters:  06/20/21 125/70  12/15/20 134/64  09/24/20 113/61    Wt Readings from Last 3 Encounters:  06/20/21 225 lb 12.8 oz (102.4 kg)  12/15/20 229 lb 6.4 oz (104.1 kg)  09/24/20 219 lb (99.3 kg)     Physical Exam Constitutional:      Appearance: He is well-developed.  HENT:     Head: Normocephalic and atraumatic.  Eyes:     Pupils: Pupils are equal, round, and reactive to light.  Neck:     Thyroid: No thyromegaly.     Trachea: No tracheal deviation.  Cardiovascular:     Rate and Rhythm: Normal rate and regular rhythm.     Heart sounds: Normal heart sounds. No murmur heard.   No friction  rub. No gallop.  Pulmonary:     Breath sounds: Normal breath sounds. No wheezing or rales.  Abdominal:     General: Bowel sounds are normal. There is no distension.     Palpations: Abdomen is soft. There is no mass.     Tenderness: There is no abdominal tenderness.     Hernia: There is no hernia in the left inguinal area.  Genitourinary:    Penis: Normal.      Testes: Normal.  Musculoskeletal:        General: Normal range of motion.     Cervical back: Normal range of motion.  Lymphadenopathy:     Cervical: No cervical adenopathy.  Skin:    General: Skin is warm  and dry.  Neurological:     Mental Status: He is alert and oriented to person, place, and time.      Assessment & Plan:   [name] [name] was seen today for annual exam.  Diagnoses and all orders for this visit:  Well adult exam -     CBC with Differential/Platelet -     CMP14+EGFR -     Lipid panel -     VITAMIN D 25 Hydroxy (Vit-D Deficiency, Fractures) -     PSA, total and free  Primary hypertension -     CBC with Differential/Platelet -     CMP14+EGFR -     amLODipine (NORVASC) 10 MG tablet; Take 1 tablet (10 mg total) by mouth daily. -     chlorthalidone (HYGROTON) 25 MG tablet; Take 1 tablet (25 mg total) by mouth daily.  Dyslipidemia -     Lipid panel  Vitamin D deficiency -     VITAMIN D 25 Hydroxy (Vit-D Deficiency, Fractures)  Screening for prostate cancer -     PSA, total and free  Stage 3a chronic kidney disease (HCC) -     chlorthalidone (HYGROTON) 25 MG tablet; Take 1 tablet (25 mg total) by mouth daily.      I have discontinued [name] [name]. Record's Vemlidy. I have also changed his chlorthalidone. Additionally, I am having him maintain his triamcinolone cream, ciclopirox, Vitamin D (Ergocalciferol), multivitamin, and amLODipine.  Allergies as of 06/20/2021   No Known Allergies      Medication List        Accurate as of June 20, 2021  9:32 AM. If you have any questions, ask your nurse or doctor.          STOP taking these medications    Vemlidy 25 MG Tabs Generic drug: Tenofovir Alafenamide Fumarate Stopped by: Claretta Fraise, MD       TAKE these medications    amLODipine 10 MG tablet Commonly known as: NORVASC Take 1 tablet (10 mg total) by mouth daily. What changed: Another medication with the same name was removed. Continue taking this medication, and follow the directions you see here. Changed by: Claretta Fraise, MD   chlorthalidone 25 MG tablet Commonly known as: HYGROTON Take 1 tablet (25 mg total) by mouth daily.    ciclopirox 8 % solution Commonly known as: Penlac Apply topically at bedtime. Apply over nail and surrounding skin. Apply daily over previous coat. After seven (7) days, may remove with alcohol and continue cycle.   multivitamin tablet Take 1 tablet by mouth daily.   triamcinolone cream 0.1 % Commonly known as: KENALOG Apply 1 application topically 2 (two) times daily. Avoid face and genitalia   Vitamin D (Ergocalciferol) 1.25 MG (50000 UNIT) Caps capsule Commonly  known as: DRISDOL TAKE 1 CAPSULE (50,000 UNITS TOTAL) BY MOUTH EVERY 7 (SEVEN) DAYS.         Follow-up: Return in about 6 months (around 12/18/2021).  Claretta Fraise, M.D.

## 2021-06-21 LAB — CMP14+EGFR
ALT: 37 IU/L (ref 0–44)
AST: 28 IU/L (ref 0–40)
Albumin/Globulin Ratio: 2 (ref 1.2–2.2)
Albumin: 4.9 g/dL — ABNORMAL HIGH (ref 3.8–4.8)
Alkaline Phosphatase: 50 IU/L (ref 44–121)
BUN/Creatinine Ratio: 12 (ref 10–24)
BUN: 19 mg/dL (ref 8–27)
Bilirubin Total: 0.3 mg/dL (ref 0.0–1.2)
CO2: 27 mmol/L (ref 20–29)
Calcium: 10.1 mg/dL (ref 8.6–10.2)
Chloride: 102 mmol/L (ref 96–106)
Creatinine, Ser: 1.65 mg/dL — ABNORMAL HIGH (ref 0.76–1.27)
Globulin, Total: 2.4 g/dL (ref 1.5–4.5)
Glucose: 102 mg/dL — ABNORMAL HIGH (ref 70–99)
Potassium: 3.8 mmol/L (ref 3.5–5.2)
Sodium: 145 mmol/L — ABNORMAL HIGH (ref 134–144)
Total Protein: 7.3 g/dL (ref 6.0–8.5)
eGFR: 47 mL/min/{1.73_m2} — ABNORMAL LOW (ref >59)

## 2021-06-21 LAB — CBC WITH DIFFERENTIAL/PLATELET
Basophils Absolute: 0 10*3/uL (ref 0.0–0.2)
Basos: 1 %
EOS (ABSOLUTE): 0.2 10*3/uL (ref 0.0–0.4)
Eos: 2 %
Hematocrit: 43 % (ref 37.5–51.0)
Hemoglobin: 14.6 g/dL (ref 13.0–17.7)
Immature Grans (Abs): 0 10*3/uL (ref 0.0–0.1)
Immature Granulocytes: 0 %
Lymphocytes Absolute: 3 10*3/uL (ref 0.7–3.1)
Lymphs: 40 %
MCH: 29.5 pg (ref 26.6–33.0)
MCHC: 34 g/dL (ref 31.5–35.7)
MCV: 87 fL (ref 79–97)
Monocytes Absolute: 0.5 10*3/uL (ref 0.1–0.9)
Monocytes: 7 %
Neutrophils Absolute: 3.7 10*3/uL (ref 1.4–7.0)
Neutrophils: 50 %
Platelets: 227 10*3/uL (ref 150–450)
RBC: 4.95 x10E6/uL (ref 4.14–5.80)
RDW: 12 % (ref 11.6–15.4)
WBC: 7.3 10*3/uL (ref 3.4–10.8)

## 2021-06-21 LAB — LIPID PANEL
Chol/HDL Ratio: 4.6 ratio (ref 0.0–5.0)
Cholesterol, Total: 188 mg/dL (ref 100–199)
HDL: 41 mg/dL (ref >39)
LDL Chol Calc (NIH): 126 mg/dL — ABNORMAL HIGH (ref 0–99)
Triglycerides: 114 mg/dL (ref 0–149)
VLDL Cholesterol Cal: 21 mg/dL (ref 5–40)

## 2021-06-21 LAB — PSA, TOTAL AND FREE
PSA, Free Pct: 41.3 %
PSA, Free: 0.33 ng/mL
Prostate Specific Ag, Serum: 0.8 ng/mL (ref 0.0–4.0)

## 2021-06-21 LAB — VITAMIN D 25 HYDROXY (VIT D DEFICIENCY, FRACTURES): Vit D, 25-Hydroxy: 20.8 ng/mL — ABNORMAL LOW (ref 30.0–100.0)

## 2021-06-21 NOTE — Progress Notes (Signed)
Dear [name] [name], Your Vitamin D is  low. You need a prescription strength supplement I will send that in for you. Nurse, if at all possible, could you send in a prescription for the patient for vitamin D 50,000 units, 1 p.o. weekly #13 with 3 refills? Many thanks, WS

## 2021-06-22 ENCOUNTER — Other Ambulatory Visit: Payer: Self-pay | Admitting: *Deleted

## 2021-08-09 ENCOUNTER — Telehealth: Payer: BC Managed Care – PPO | Admitting: Family Medicine

## 2021-09-02 ENCOUNTER — Other Ambulatory Visit: Payer: Self-pay | Admitting: Nurse Practitioner

## 2021-09-02 DIAGNOSIS — B181 Chronic viral hepatitis B without delta-agent: Secondary | ICD-10-CM

## 2021-09-27 ENCOUNTER — Other Ambulatory Visit: Payer: Self-pay

## 2021-09-27 ENCOUNTER — Ambulatory Visit
Admission: RE | Admit: 2021-09-27 | Discharge: 2021-09-27 | Disposition: A | Discharge status: Home / Self Care | Payer: BC Managed Care – PPO | Source: Ambulatory Visit | Attending: Nurse Practitioner | Admitting: Nurse Practitioner

## 2021-09-27 DIAGNOSIS — B181 Chronic viral hepatitis B without delta-agent: Secondary | ICD-10-CM

## 2021-12-19 ENCOUNTER — Encounter: Payer: Self-pay | Admitting: Family Medicine

## 2021-12-19 ENCOUNTER — Ambulatory Visit: Payer: BC Managed Care – PPO | Admitting: Family Medicine

## 2021-12-19 VITALS — BP 136/67 | HR 71 | Temp 97.3°F | Ht 69.0 in | Wt 222.0 lb

## 2021-12-19 DIAGNOSIS — E785 Hyperlipidemia, unspecified: Secondary | ICD-10-CM | POA: Diagnosis not present

## 2021-12-19 DIAGNOSIS — R109 Unspecified abdominal pain: Secondary | ICD-10-CM | POA: Diagnosis not present

## 2021-12-19 DIAGNOSIS — Z23 Encounter for immunization: Secondary | ICD-10-CM

## 2021-12-19 DIAGNOSIS — J301 Allergic rhinitis due to pollen: Secondary | ICD-10-CM

## 2021-12-19 DIAGNOSIS — I10 Essential (primary) hypertension: Secondary | ICD-10-CM | POA: Diagnosis not present

## 2021-12-19 DIAGNOSIS — R10A0 Flank pain, unspecified side: Secondary | ICD-10-CM

## 2021-12-19 LAB — URINALYSIS, COMPLETE
Bilirubin, UA: NEGATIVE
Glucose, UA: NEGATIVE
Ketones, UA: NEGATIVE
Leukocytes,UA: NEGATIVE
Nitrite, UA: NEGATIVE
Protein,UA: NEGATIVE
Specific Gravity, UA: 1.02 (ref 1.005–1.030)
Urobilinogen, Ur: 0.2 mg/dL (ref 0.2–1.0)
pH, UA: 6 (ref 5.0–7.5)

## 2021-12-19 LAB — MICROSCOPIC EXAMINATION

## 2021-12-19 NOTE — Progress Notes (Signed)
? ?Subjective:  ?Patient ID: [name] [name], male    DOB: 21-Apr-1961  Age: 61 y.o. MRN: [id number] ? ?CC: Medical Management of Chronic Issues ? ? ?HPI ?[name] [name] presents for  follow-up of hypertension. Patient has no history of headache chest pain or shortness of breath or recent cough. Patient also denies symptoms of TIA such as focal numbness or weakness. Patient denies side effects from medication. States taking it regularly. ? ? in for follow-up of elevated cholesterol. . Currently no chest pain, shortness of breath or other cardiovascular related symptoms noted. ? ?Bilateral flank pain intermittent. Onset 2 weeks ago. ? ? ? ?History ?[name] [name] has a past medical history of Chronic kidney disease, Hep B w/o coma, chronic, w/o delta ([place]), Hypertension, and Tattoos.  ? ?[name] [name] has a past surgical history that includes Colonoscopy (05/25/2010).  ? ?His family history includes Diabetes in his father and mother; Hypertension in his mother.[name] [name] reports that [name] [name] has never smoked. [name] [name] has never used smokeless tobacco. [name] [name] reports that [name] [name] does not drink alcohol and does not use drugs. ? ?Current Outpatient Medications on File Prior to Visit  ?Medication Sig Dispense Refill  ? amLODipine (NORVASC) 10 MG tablet Take 1 tablet (10 mg total) by mouth daily. 90 tablet 3  ? chlorthalidone (HYGROTON) 25 MG tablet Take 1 tablet (25 mg total) by mouth daily. 90 tablet 3  ? ciclopirox (PENLAC) 8 % solution Apply topically at bedtime. Apply over nail and surrounding skin. Apply daily over previous coat. After seven (7) days, may remove with alcohol and continue cycle. 6.6 mL 5  ? Multiple Vitamin (MULTIVITAMIN) tablet Take 1 tablet by mouth daily.    ? triamcinolone cream (KENALOG) 0.1 % Apply 1 application topically 2 (two) times daily. Avoid face and genitalia 453 g 3  ? Vitamin D, Ergocalciferol, (DRISDOL) 1.25 MG (50000 UNIT) CAPS capsule TAKE 1 CAPSULE (50,000 UNITS TOTAL) BY MOUTH EVERY 7 (SEVEN) DAYS. 13 capsule 1  ? ?No  current facility-administered medications on file prior to visit.  ? ? ?ROS ?Review of Systems  ?Constitutional:  Negative for fever.  ?HENT:  Positive for voice change (hoarseness in the mornings onset last week,). Negative for postnasal drip, sneezing and sore throat.   ?Respiratory:  Negative for shortness of breath.   ?Cardiovascular:  Negative for chest pain.  ?Musculoskeletal:  Negative for arthralgias.  ?Skin:  Negative for rash.  ? ?Objective:  ?BP 136/67   Pulse 71   Temp (!) 97.3 ?F (36.3 ?C)   Ht 5' 9"  (1.753 m)   Wt 222 lb (100.7 kg)   SpO2 97%   BMI 32.78 kg/m?  ? ?BP Readings from Last 3 Encounters:  ?12/19/21 136/67  ?06/20/21 125/70  ?12/15/20 134/64  ? ? ?Wt Readings from Last 3 Encounters:  ?12/19/21 222 lb (100.7 kg)  ?06/20/21 225 lb 12.8 oz (102.4 kg)  ?12/15/20 229 lb 6.4 oz (104.1 kg)  ? ? ? ?Physical Exam ?Vitals reviewed.  ?Constitutional:   ?   Appearance: [name] [name] is well-developed.  ?HENT:  ?   Head: Normocephalic and atraumatic.  ?   Right Ear: External ear normal.  ?   Left Ear: External ear normal.  ?   Mouth/Throat:  ?   Pharynx: No oropharyngeal exudate or posterior oropharyngeal erythema.  ?Eyes:  ?   Pupils: Pupils are equal, round, and reactive to light.  ?Cardiovascular:  ?   Rate and Rhythm: Normal rate and regular rhythm.  ?   Heart sounds:  No murmur heard. ?Pulmonary:  ?   Effort: No respiratory distress.  ?   Breath sounds: Normal breath sounds.  ?Musculoskeletal:     ?   General: Tenderness (lower lumbar, spinalis, mild) present.  ?   Cervical back: Normal range of motion and neck supple.  ?Neurological:  ?   Mental Status: [name] [name] is alert and oriented to person, place, and time.  ? ? ? ? ?Assessment & Plan:  ? ?[name] [name] was seen today for medical management of chronic issues. ? ?Diagnoses and all orders for this visit: ? ?Dyslipidemia ?-     Lipid panel ? ?Primary hypertension ?-     CMP14+EGFR ?-     CBC with Differential/Platelet ? ?Flank pain ?-     Urinalysis,  Complete ? ?Seasonal allergic rhinitis due to pollen ? ? ?Allergies as of 12/19/2021   ?No Known Allergies ?  ? ?  ?Medication List  ?  ? ?  ? Accurate as of Dec 19, 2021 10:19 AM. If you have any questions, ask your nurse or doctor.  ?  ?  ? ?  ? ?amLODipine 10 MG tablet ?Commonly known as: NORVASC ?Take 1 tablet (10 mg total) by mouth daily. ?  ?chlorthalidone 25 MG tablet ?Commonly known as: HYGROTON ?Take 1 tablet (25 mg total) by mouth daily. ?  ?ciclopirox 8 % solution ?Commonly known as: Penlac ?Apply topically at bedtime. Apply over nail and surrounding skin. Apply daily over previous coat. After seven (7) days, may remove with alcohol and continue cycle. ?  ?multivitamin tablet ?Take 1 tablet by mouth daily. ?  ?triamcinolone cream 0.1 % ?Commonly known as: KENALOG ?Apply 1 application topically 2 (two) times daily. Avoid face and genitalia ?  ?Vitamin D (Ergocalciferol) 1.25 MG (50000 UNIT) Caps capsule ?Commonly known as: DRISDOL ?TAKE 1 CAPSULE (50,000 UNITS TOTAL) BY MOUTH EVERY 7 (SEVEN) DAYS. ?  ? ?  ? ? ? ? ? ? ?Follow-up: Return in about 6 months (around 06/21/2022) for [facility] physical. ? ?Claretta Fraise, M.D. ?

## 2021-12-19 NOTE — Addendum Note (Signed)
Addended by: Christia Reading on: 12/19/2021 05:28 PM ? ? Modules accepted: Orders ? ?

## 2021-12-20 LAB — CBC WITH DIFFERENTIAL/PLATELET
Basophils Absolute: 0 10*3/uL (ref 0.0–0.2)
Basos: 1 %
EOS (ABSOLUTE): 0.2 10*3/uL (ref 0.0–0.4)
Eos: 3 %
Hematocrit: 43.2 % (ref 37.5–51.0)
Hemoglobin: 14.6 g/dL (ref 13.0–17.7)
Immature Grans (Abs): 0 10*3/uL (ref 0.0–0.1)
Immature Granulocytes: 0 %
Lymphocytes Absolute: 2.8 10*3/uL (ref 0.7–3.1)
Lymphs: 37 %
MCH: 29.9 pg (ref 26.6–33.0)
MCHC: 33.8 g/dL (ref 31.5–35.7)
MCV: 89 fL (ref 79–97)
Monocytes Absolute: 0.7 10*3/uL (ref 0.1–0.9)
Monocytes: 9 %
Neutrophils Absolute: 3.7 10*3/uL (ref 1.4–7.0)
Neutrophils: 50 %
Platelets: 218 10*3/uL (ref 150–450)
RBC: 4.88 x10E6/uL (ref 4.14–5.80)
RDW: 12.4 % (ref 11.6–15.4)
WBC: 7.4 10*3/uL (ref 3.4–10.8)

## 2021-12-20 LAB — CMP14+EGFR
ALT: 29 IU/L (ref 0–44)
AST: 27 IU/L (ref 0–40)
Albumin/Globulin Ratio: 1.7 (ref 1.2–2.2)
Albumin: 4.5 g/dL (ref 3.8–4.8)
Alkaline Phosphatase: 50 IU/L (ref 44–121)
BUN/Creatinine Ratio: 16 (ref 10–24)
BUN: 23 mg/dL (ref 8–27)
Bilirubin Total: 0.3 mg/dL (ref 0.0–1.2)
CO2: 27 mmol/L (ref 20–29)
Calcium: 10 mg/dL (ref 8.6–10.2)
Chloride: 100 mmol/L (ref 96–106)
Creatinine, Ser: 1.41 mg/dL — ABNORMAL HIGH (ref 0.76–1.27)
Globulin, Total: 2.6 g/dL (ref 1.5–4.5)
Glucose: 95 mg/dL (ref 70–99)
Potassium: 3.5 mmol/L (ref 3.5–5.2)
Sodium: 141 mmol/L (ref 134–144)
Total Protein: 7.1 g/dL (ref 6.0–8.5)
eGFR: 56 mL/min/{1.73_m2} — ABNORMAL LOW (ref >59)

## 2021-12-20 LAB — LIPID PANEL
Chol/HDL Ratio: 4.6 ratio (ref 0.0–5.0)
Cholesterol, Total: 185 mg/dL (ref 100–199)
HDL: 40 mg/dL (ref >39)
LDL Chol Calc (NIH): 120 mg/dL — ABNORMAL HIGH (ref 0–99)
Triglycerides: 139 mg/dL (ref 0–149)
VLDL Cholesterol Cal: 25 mg/dL (ref 5–40)

## 2022-01-18 ENCOUNTER — Ambulatory Visit: Payer: BC Managed Care – PPO | Admitting: Nurse Practitioner

## 2022-01-18 ENCOUNTER — Encounter: Payer: Self-pay | Admitting: Nurse Practitioner

## 2022-01-18 VITALS — BP 126/70 | HR 69 | Ht 69.0 in | Wt 221.0 lb

## 2022-01-18 DIAGNOSIS — R42 Dizziness and giddiness: Secondary | ICD-10-CM | POA: Diagnosis not present

## 2022-01-18 MED ORDER — MECLIZINE HCL 12.5 MG PO TABS: 12.5000 mg | ORAL_TABLET | Freq: Three times a day (TID) | ORAL | 0 refills | Status: DC | PRN

## 2022-01-18 NOTE — Progress Notes (Signed)
Acute Office Visit  Subjective:     Patient ID: [name] [name], male    DOB: 17-Aug-1960, 61 y.o.   MRN: [id number]  Chief Complaint  Patient presents with   Dizziness    This morning    Ear Pain    Off and on for a week     Dizziness This is a new problem. The current episode started yesterday. The problem occurs intermittently. The problem has been gradually improving. Associated symptoms include headaches, nausea and vertigo. Pertinent negatives include no abdominal pain, arthralgias, chills, congestion, coughing, fatigue, fever, myalgias, rash, sore throat, swollen glands or vomiting. Nothing aggravates the symptoms. He has tried nothing for the symptoms.   Review of Systems  Constitutional:  Negative for chills, fatigue and fever.  HENT:  Negative for congestion and sore throat.   Respiratory:  Negative for cough.   Gastrointestinal:  Positive for nausea. Negative for abdominal pain and vomiting.  Musculoskeletal:  Negative for arthralgias and myalgias.  Skin:  Negative for rash.  Neurological:  Positive for dizziness, vertigo and headaches.  All other systems reviewed and are negative.      Objective:    BP 126/70   Pulse 69   Ht '5\' 9"'$  (1.753 m)   Wt 221 lb (100.2 kg)   SpO2 96%   BMI 32.64 kg/m  BP Readings from Last 3 Encounters:  01/18/22 126/70  12/19/21 136/67  06/20/21 125/70   Wt Readings from Last 3 Encounters:  01/18/22 221 lb (100.2 kg)  12/19/21 222 lb (100.7 kg)  06/20/21 225 lb 12.8 oz (102.4 kg)      Physical Exam Vitals and nursing note reviewed.  Constitutional:      Appearance: Normal appearance.  HENT:     Head: Normocephalic.     Right Ear: External ear normal.     Left Ear: External ear normal.     Nose: Nose normal.     Mouth/Throat:     Mouth: Mucous membranes are moist.     Pharynx: Oropharynx is clear.  Eyes:     Conjunctiva/sclera: Conjunctivae normal.  Cardiovascular:     Rate and Rhythm: Normal rate and regular  rhythm.     Pulses: Normal pulses.     Heart sounds: Normal heart sounds.  Pulmonary:     Effort: Pulmonary effort is normal.  Abdominal:     General: Bowel sounds are normal.  Musculoskeletal:        General: Normal range of motion.  Skin:    General: Skin is warm.     Findings: No rash.  Neurological:     General: No focal deficit present.     Mental Status: He is alert and oriented to person, place, and time.     Motor: No weakness.  Psychiatric:        Behavior: Behavior normal.    No results found for any visits on 01/18/22.      Assessment & Plan:  Symptoms of vertigo gradually resolving.  This is new for patient in the past 24 hours.  Started patient on meclizine 10 mg tablet by mouth as needed.  Tylenol for headache.  Education provided to patient to increase hydration, rest and take all medication as prescribed and follow-up with worsening unresolved symptoms.   Problem List Items Addressed This Visit   None Visit Diagnoses     Vertigo    -  Primary   Relevant Medications   meclizine (ANTIVERT) 12.5 MG tablet  Meds ordered this encounter  Medications   meclizine (ANTIVERT) 12.5 MG tablet    Sig: Take 1 tablet (12.5 mg total) by mouth 3 (three) times daily as needed for dizziness.    Dispense:  30 tablet    Refill:  0    Order Specific Question:   Supervising Provider    Answer:   Claretta Fraise [id number]    No follow-ups on file.  Ivy Lynn, NP

## 2022-01-18 NOTE — Patient Instructions (Signed)

## 2022-03-01 ENCOUNTER — Other Ambulatory Visit: Payer: Self-pay | Admitting: Nurse Practitioner

## 2022-03-01 DIAGNOSIS — B181 Chronic viral hepatitis B without delta-agent: Secondary | ICD-10-CM

## 2022-03-21 ENCOUNTER — Ambulatory Visit
Admission: RE | Admit: 2022-03-21 | Discharge: 2022-03-21 | Disposition: A | Discharge status: Home / Self Care | Payer: BC Managed Care – PPO | Source: Ambulatory Visit | Attending: Nurse Practitioner | Admitting: Nurse Practitioner

## 2022-03-21 DIAGNOSIS — B181 Chronic viral hepatitis B without delta-agent: Secondary | ICD-10-CM

## 2022-05-23 ENCOUNTER — Other Ambulatory Visit: Payer: Self-pay | Admitting: Family Medicine

## 2022-05-23 DIAGNOSIS — I10 Essential (primary) hypertension: Secondary | ICD-10-CM

## 2022-05-26 ENCOUNTER — Encounter: Payer: Self-pay | Admitting: Family Medicine

## 2022-05-26 ENCOUNTER — Ambulatory Visit: Payer: BC Managed Care – PPO | Admitting: Family Medicine

## 2022-05-26 VITALS — BP 124/65 | HR 65 | Temp 97.4°F | Ht 69.0 in | Wt 224.4 lb

## 2022-05-26 DIAGNOSIS — R300 Dysuria: Secondary | ICD-10-CM

## 2022-05-26 LAB — URINALYSIS, ROUTINE W REFLEX MICROSCOPIC
Bilirubin, UA: NEGATIVE
Glucose, UA: NEGATIVE
Ketones, UA: NEGATIVE
Leukocytes,UA: NEGATIVE
Nitrite, UA: NEGATIVE
RBC, UA: NEGATIVE
Specific Gravity, UA: 1.025 (ref 1.005–1.030)
Urobilinogen, Ur: 0.2 mg/dL (ref 0.2–1.0)
pH, UA: 5.5 (ref 5.0–7.5)

## 2022-05-26 LAB — MICROSCOPIC EXAMINATION
RBC, Urine: NONE SEEN /hpf (ref 0–2)
Renal Epithel, UA: NONE SEEN /hpf

## 2022-05-26 MED ORDER — SULFAMETHOXAZOLE-TRIMETHOPRIM 800-160 MG PO TABS: 1.0000 | ORAL_TABLET | Freq: Two times a day (BID) | ORAL | 0 refills | Status: AC

## 2022-05-26 NOTE — Progress Notes (Signed)
   Acute Office Visit  Subjective:     Patient ID: [name] [name], male    DOB: [birth year], 61 y.o.   MRN: [id number]  Chief Complaint  Patient presents with   Flank Pain   Urinary Frequency    Urinary Frequency  Associated symptoms include frequency.   Patient is in today for dysuria, frequency, and urgency for 1 week. Symptoms have been unchanged. He denies discharge, swelling, or pain. Denies concerns for STI. Denies fever, chills, abdominal pain, nausea, vomiting, or flank pain. He does have some mild lower back pain but this is typical for him as he lifts weights regularly. He reports that he is well hydrated. Reports a history of a UTI with similar symptoms.   Review of Systems  Genitourinary:  Positive for frequency.   As per HPI.      Objective:    BP 124/65   Pulse 65   Temp (!) 97.4 F (36.3 C) (Temporal)   Ht '5\' 9"'$  (1.753 m)   Wt 224 lb 6 oz (101.8 kg)   SpO2 96%   BMI 33.13 kg/m    Physical Exam Vitals and nursing note reviewed.  Constitutional:      General: He is not in acute distress.    Appearance: He is not ill-appearing, toxic-appearing or diaphoretic.  Cardiovascular:     Rate and Rhythm: Normal rate and regular rhythm.  Pulmonary:     Effort: Pulmonary effort is normal.     Breath sounds: Normal breath sounds.  Abdominal:     General: Bowel sounds are normal. There is no distension.     Palpations: Abdomen is soft.     Tenderness: There is no abdominal tenderness. There is no right CVA tenderness, left CVA tenderness, guarding or rebound.  Musculoskeletal:     Right lower leg: No edema.     Left lower leg: No edema.  Skin:    General: Skin is warm and dry.  Neurological:     General: No focal deficit present.     Mental Status: He is alert and oriented to person, place, and time.  Psychiatric:        Mood and Affect: Mood normal.        Behavior: Behavior normal.    Urine dipstick shows positive for protein.  Micro exam: 0-5 WBC's  per HPF, 0 RBC's per HPF, few + bacteria, and hyaline casts seen.     Assessment & Plan:   [name] [name] was seen today for urinary frequency.  Diagnoses and all orders for this visit:  Dysuria UA is not compelling for UTI, however will treat empirically with bactrim over the weekend pending urine culture. Discussed hydration and return precautions.  -     Urinalysis, Routine w reflex microscopic -     sulfamethoxazole-trimethoprim (BACTRIM DS) 800-160 MG tablet; Take 1 tablet by mouth 2 (two) times daily for 7 days. -     Urine Culture  Return if symptoms worsen or fail to improve.  The patient indicates understanding of these issues and agrees with the plan.   Gwenlyn Perking, FNP

## 2022-05-26 NOTE — Patient Instructions (Signed)

## 2022-05-28 LAB — URINE CULTURE

## 2022-06-22 ENCOUNTER — Encounter: Payer: BC Managed Care – PPO | Admitting: Family Medicine

## 2022-07-24 ENCOUNTER — Ambulatory Visit (INDEPENDENT_AMBULATORY_CARE_PROVIDER_SITE_OTHER): Payer: BC Managed Care – PPO | Admitting: Family Medicine

## 2022-07-24 ENCOUNTER — Encounter: Payer: Self-pay | Admitting: Family Medicine

## 2022-07-24 VITALS — BP 133/70 | HR 66 | Temp 97.6°F | Ht 69.0 in | Wt 228.0 lb

## 2022-07-24 DIAGNOSIS — B181 Chronic viral hepatitis B without delta-agent: Secondary | ICD-10-CM

## 2022-07-24 DIAGNOSIS — E559 Vitamin D deficiency, unspecified: Secondary | ICD-10-CM

## 2022-07-24 DIAGNOSIS — E785 Hyperlipidemia, unspecified: Secondary | ICD-10-CM

## 2022-07-24 DIAGNOSIS — N1831 Chronic kidney disease, stage 3a: Secondary | ICD-10-CM

## 2022-07-24 DIAGNOSIS — I10 Essential (primary) hypertension: Secondary | ICD-10-CM

## 2022-07-24 DIAGNOSIS — F5221 Male erectile disorder: Secondary | ICD-10-CM

## 2022-07-24 DIAGNOSIS — Z0001 Encounter for general adult medical examination with abnormal findings: Secondary | ICD-10-CM | POA: Diagnosis not present

## 2022-07-24 DIAGNOSIS — R300 Dysuria: Secondary | ICD-10-CM

## 2022-07-24 DIAGNOSIS — Z23 Encounter for immunization: Secondary | ICD-10-CM

## 2022-07-24 DIAGNOSIS — Z125 Encounter for screening for malignant neoplasm of prostate: Secondary | ICD-10-CM

## 2022-07-24 DIAGNOSIS — Z0000 Encounter for general adult medical examination without abnormal findings: Secondary | ICD-10-CM

## 2022-07-24 LAB — URINALYSIS, COMPLETE
Bilirubin, UA: NEGATIVE
Glucose, UA: NEGATIVE
Ketones, UA: NEGATIVE
Leukocytes,UA: NEGATIVE
Nitrite, UA: NEGATIVE
Protein,UA: NEGATIVE
RBC, UA: NEGATIVE
Specific Gravity, UA: 1.015 (ref 1.005–1.030)
Urobilinogen, Ur: 0.2 mg/dL (ref 0.2–1.0)
pH, UA: 7.5 (ref 5.0–7.5)

## 2022-07-24 LAB — MICROSCOPIC EXAMINATION
Bacteria, UA: NONE SEEN
Epithelial Cells (non renal): NONE SEEN /hpf (ref 0–10)
RBC, Urine: NONE SEEN /hpf (ref 0–2)
Renal Epithel, UA: NONE SEEN /hpf
WBC, UA: NONE SEEN /hpf (ref 0–5)

## 2022-07-24 MED ORDER — AMLODIPINE BESYLATE 10 MG PO TABS: 10.0000 mg | ORAL_TABLET | Freq: Every day | ORAL | 3 refills | Status: DC

## 2022-07-24 MED ORDER — CHLORTHALIDONE 25 MG PO TABS: 25.0000 mg | ORAL_TABLET | Freq: Every day | ORAL | 3 refills | Status: DC

## 2022-07-24 NOTE — Progress Notes (Signed)
Subjective:  Patient ID: [name] [name], male    DOB: June 04, 1961  Age: 61 y.o. MRN: [id number]  CC: Annual Exam   HPI WANE MOLLETT presents for CPE. Got bored with retirement. Now back at work.      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   He has a past surgical history that includes Colonoscopy (05/25/2010).   His family history includes Diabetes in his father and mother; Hypertension in his mother.He reports that he has never smoked. He has never used smokeless tobacco. He reports that he does not drink alcohol and does not use drugs.    ROS Review of Systems  Constitutional:  Negative for activity change, fatigue, fever and unexpected weight change.  HENT:  Negative for congestion, ear pain, hearing loss, postnasal drip and trouble swallowing.   Eyes:  Negative for pain and visual disturbance.  Respiratory:  Positive for cough (since covid 2 years ago.). Negative for chest tightness and shortness of breath.   Cardiovascular:  Negative for chest pain, palpitations and leg swelling.  Gastrointestinal:  Negative for abdominal distention, abdominal pain, blood in stool, constipation, diarrhea, nausea and vomiting.  Endocrine: Negative for cold intolerance, heat intolerance and polydipsia.  Genitourinary:  Negative for difficulty urinating, dysuria, flank pain, frequency and urgency.       Urine stream not as strong as  it used to be. Occasional nocturia.   Musculoskeletal:  Negative for arthralgias and joint swelling.  Skin:  Negative for color change, rash and wound.  Neurological:  Negative for dizziness, syncope, speech difficulty, weakness, light-headedness, numbness and headaches.  Hematological:  Does not bruise/bleed easily.  Psychiatric/Behavioral:  Negative for confusion, decreased concentration, dysphoric mood and sleep disturbance. The patient is not nervous/anxious.     Objective:  BP 133/70   Pulse 66   Temp 97.6 F (36.4 C)   Ht _0  (1.753 m)   Wt 228 lb (103.4 kg)   SpO2 98%   BMI 33.67 kg/m   BP Readings from Last 3 Encounters:  07/24/22 133/70  05/26/22 124/65  01/18/22 126/70    Wt Readings from Last 3 Encounters:  07/24/22 228 lb (103.4 kg)  05/26/22 224 lb 6 oz (101.8 kg)  01/18/22 221 lb (100.2 kg)     Physical Exam Constitutional:      Appearance: He is well-developed.  HENT:     Head: Normocephalic and atraumatic.  Eyes:     Pupils: Pupils are equal, round, and reactive to light.  Neck:     Thyroid: No thyromegaly.     Trachea: No tracheal deviation.  Cardiovascular:     Rate and Rhythm: Normal rate and regular rhythm.     Heart sounds: Normal heart sounds. No murmur heard.  No friction rub. No gallop.  Pulmonary:     Breath sounds: Normal breath sounds. No wheezing or rales.  Abdominal:     General: Bowel sounds are normal. There is no distension.     Palpations: Abdomen is soft. There is no mass.     Tenderness: There is no abdominal tenderness.     Hernia: There is no hernia in the left inguinal area.  Genitourinary:    Penis: Normal.      Testes: Normal.  Musculoskeletal:        General: Normal range of motion.     Cervical back: Normal range of motion.  Lymphadenopathy:     Cervical: No cervical adenopathy.  Skin:    General: Skin is warm and dry.  Neurological:     Mental Status: He is alert and oriented to person, place, and time.        Assessment & Plan:   Jantzen was seen today for annual exam.  Diagnoses and all orders for this visit:  Well adult exam -     CBC with Differential/Platelet -     CMP14+EGFR -     Lipid panel -     PSA, total and free -     VITAMIN D 25 Hydroxy (Vit-D Deficiency, Fractures)  Dyslipidemia -     Lipid panel  Primary hypertension -     CBC with Differential/Platelet -     CMP14+EGFR -     amLODipine (NORVASC) 10 MG tablet; Take 1 tablet (10 mg total) by mouth daily. -     chlorthalidone (HYGROTON) 25 MG tablet; Take 1 tablet (25 mg total) by mouth daily.  Vitamin D deficiency -     VITAMIN D 25 Hydroxy (Vit-D Deficiency, Fractures)  Prostate cancer screening -     PSA, total and free  Stage 3a chronic kidney disease (HCC) -     chlorthalidone (HYGROTON) 25 MG tablet; Take 1 tablet (25 mg total) by mouth daily.  Dysuria -     Urinalysis, Complete -     Urine Culture  Need for influenza vaccination -     Flu Vaccine QUAD 6+ mos PF IM (Fluarix Quad PF)  Erectile disorder, generalized, mild -     Testosterone,Free and Total  Chronic viral hepatitis B without delta agent and without coma ([place])  Other orders -     Microscopic Examination       I have discontinued Toney Reil. Sean "Reggie"'s multivitamin and Vitamin D (Ergocalciferol). I have also changed his amLODipine. Additionally, I am having him maintain his Vemlidy and chlorthalidone.  Allergies as of 07/24/2022   No Known Allergies      Medication List        Accurate as of July 24, 2022 10:47 PM. If you have any questions, ask your nurse or doctor.          STOP taking these medications    multivitamin tablet Stopped by: Claretta Fraise, MD   Vitamin D (Ergocalciferol) 1.25 MG (50000 UNIT) Caps capsule Commonly known as: DRISDOL Stopped by: Claretta Fraise, MD       TAKE these medications    amLODipine 10 MG tablet Commonly known as: NORVASC Take 1 tablet (10 mg total)  by mouth daily.   chlorthalidone 25 MG tablet Commonly known as: HYGROTON Take 1 tablet (25 mg total) by mouth daily.   Vemlidy 25 MG Tabs Generic drug: Tenofovir Alafenamide Fumarate Take 25 mg by mouth daily.  Follow-up: Return in about 6 months (around 01/23/2023).  Claretta Fraise, M.D.

## 2022-07-25 LAB — CMP14+EGFR
ALT: 34 IU/L (ref 0–44)
AST: 28 IU/L (ref 0–40)
Albumin/Globulin Ratio: 1.6 (ref 1.2–2.2)
Albumin: 4.6 g/dL (ref 3.9–4.9)
Alkaline Phosphatase: 52 IU/L (ref 44–121)
BUN/Creatinine Ratio: 11 (ref 10–24)
BUN: 18 mg/dL (ref 8–27)
Bilirubin Total: 0.3 mg/dL (ref 0.0–1.2)
CO2: 28 mmol/L (ref 20–29)
Calcium: 9.9 mg/dL (ref 8.6–10.2)
Chloride: 99 mmol/L (ref 96–106)
Creatinine, Ser: 1.7 mg/dL — ABNORMAL HIGH (ref 0.76–1.27)
Globulin, Total: 2.8 g/dL (ref 1.5–4.5)
Glucose: 116 mg/dL — ABNORMAL HIGH (ref 70–99)
Potassium: 3.7 mmol/L (ref 3.5–5.2)
Sodium: 140 mmol/L (ref 134–144)
Total Protein: 7.4 g/dL (ref 6.0–8.5)
eGFR: 45 mL/min/{1.73_m2} — ABNORMAL LOW (ref >59)

## 2022-07-25 LAB — CBC WITH DIFFERENTIAL/PLATELET
Basophils Absolute: 0 10*3/uL (ref 0.0–0.2)
Basos: 1 %
EOS (ABSOLUTE): 0.1 10*3/uL (ref 0.0–0.4)
Eos: 2 %
Hematocrit: 43.1 % (ref 37.5–51.0)
Hemoglobin: 15.1 g/dL (ref 13.0–17.7)
Immature Grans (Abs): 0 10*3/uL (ref 0.0–0.1)
Immature Granulocytes: 0 %
Lymphocytes Absolute: 2.5 10*3/uL (ref 0.7–3.1)
Lymphs: 42 %
MCH: 30.6 pg (ref 26.6–33.0)
MCHC: 35 g/dL (ref 31.5–35.7)
MCV: 87 fL (ref 79–97)
Monocytes Absolute: 0.5 10*3/uL (ref 0.1–0.9)
Monocytes: 8 %
Neutrophils Absolute: 2.8 10*3/uL (ref 1.4–7.0)
Neutrophils: 47 %
Platelets: 197 10*3/uL (ref 150–450)
RBC: 4.94 x10E6/uL (ref 4.14–5.80)
RDW: 12.1 % (ref 11.6–15.4)
WBC: 5.9 10*3/uL (ref 3.4–10.8)

## 2022-07-25 LAB — LIPID PANEL
Chol/HDL Ratio: 4.5 ratio (ref 0.0–5.0)
Cholesterol, Total: 184 mg/dL (ref 100–199)
HDL: 41 mg/dL (ref >39)
LDL Chol Calc (NIH): 123 mg/dL — ABNORMAL HIGH (ref 0–99)
Triglycerides: 112 mg/dL (ref 0–149)
VLDL Cholesterol Cal: 20 mg/dL (ref 5–40)

## 2022-07-25 LAB — VITAMIN D 25 HYDROXY (VIT D DEFICIENCY, FRACTURES): Vit D, 25-Hydroxy: 24.1 ng/mL — ABNORMAL LOW (ref 30.0–100.0)

## 2022-07-25 LAB — PSA, TOTAL AND FREE
PSA, Free Pct: 45.7 %
PSA, Free: 0.32 ng/mL
Prostate Specific Ag, Serum: 0.7 ng/mL (ref 0.0–4.0)

## 2022-07-25 LAB — URINE CULTURE: Organism ID, Bacteria: NO GROWTH

## 2022-07-26 ENCOUNTER — Other Ambulatory Visit: Payer: Self-pay | Admitting: *Deleted

## 2022-07-26 MED ORDER — VITAMIN D (ERGOCALCIFEROL) 1.25 MG (50000 UNIT) PO CAPS: 50000.0000 [IU] | ORAL_CAPSULE | ORAL | 3 refills | Status: DC

## 2022-09-21 ENCOUNTER — Other Ambulatory Visit: Payer: Self-pay | Admitting: Nurse Practitioner

## 2022-09-21 DIAGNOSIS — K760 Fatty (change of) liver, not elsewhere classified: Secondary | ICD-10-CM

## 2022-09-21 DIAGNOSIS — B181 Chronic viral hepatitis B without delta-agent: Secondary | ICD-10-CM

## 2022-09-21 DIAGNOSIS — K7400 Hepatic fibrosis, unspecified: Secondary | ICD-10-CM

## 2022-11-01 ENCOUNTER — Telehealth: Payer: Self-pay | Admitting: Family Medicine

## 2022-11-01 ENCOUNTER — Ambulatory Visit
Admission: RE | Admit: 2022-11-01 | Discharge: 2022-11-01 | Disposition: A | Discharge status: Home / Self Care | Payer: BC Managed Care – PPO | Source: Ambulatory Visit | Attending: Nurse Practitioner | Admitting: Nurse Practitioner

## 2022-11-01 DIAGNOSIS — K760 Fatty (change of) liver, not elsewhere classified: Secondary | ICD-10-CM

## 2022-11-01 DIAGNOSIS — K7400 Hepatic fibrosis, unspecified: Secondary | ICD-10-CM

## 2022-11-01 DIAGNOSIS — I10 Essential (primary) hypertension: Secondary | ICD-10-CM

## 2022-11-01 DIAGNOSIS — N1831 Chronic kidney disease, stage 3a: Secondary | ICD-10-CM

## 2022-11-01 DIAGNOSIS — B181 Chronic viral hepatitis B without delta-agent: Secondary | ICD-10-CM

## 2022-11-01 MED ORDER — AMLODIPINE BESYLATE 10 MG PO TABS: 10.0000 mg | ORAL_TABLET | Freq: Every day | ORAL | 3 refills | Status: DC

## 2022-11-01 MED ORDER — CHLORTHALIDONE 25 MG PO TABS: 25.0000 mg | ORAL_TABLET | Freq: Every day | ORAL | 3 refills | Status: DC

## 2022-11-01 NOTE — Telephone Encounter (Signed)
Sent to to [facility]

## 2023-02-09 ENCOUNTER — Telehealth: Payer: Self-pay | Admitting: Family Medicine

## 2023-02-09 NOTE — Telephone Encounter (Signed)
  Prescription Request  02/09/2023  What is the name of the medication or equipment? CICLOPIROX 8% SOLUTION  Have you contacted your pharmacy to request a refill? YES  Which pharmacy would you like this sent to? [facility]  Pt says he had a visit with Dr Allena Katz about 3 days ago and was prescribed this medication for athletes foot. Pt is requesting that Dr Darlyn Read send refill in for him.    Patient notified that their request is being sent to the clinical staff for review and that they should receive a response within 2 business days.

## 2023-02-12 ENCOUNTER — Other Ambulatory Visit: Payer: Self-pay | Admitting: Family Medicine

## 2023-02-12 MED ORDER — CICLOPIROX 8 % EX SOLN: Freq: Every day | CUTANEOUS | 5 refills | Status: DC

## 2023-02-12 NOTE — Telephone Encounter (Signed)
Please let the patient know that I sent their prescription to their pharmacy. Thanks, WS 

## 2023-02-14 ENCOUNTER — Telehealth: Payer: Self-pay

## 2023-02-14 NOTE — Telephone Encounter (Signed)
[name] [name] (KeyRichard Miu) Rx #: [id number] Need Help? Call us at [phone] Status sent iconSent to Plan today Drug Ciclopirox 8% solution ePA cloud Psychologist, educational Electronic PA Form [phone] NCPDP) Original Claim Info 57 PRIOR AUTH REQ-MD CALL [phone].DRUG REQUIRES PRIOR AUTHORIZATION

## 2023-02-16 NOTE — Telephone Encounter (Signed)
Completed questions and submitted to insurance. Status pending.

## 2023-02-16 NOTE — Telephone Encounter (Signed)
Patient Advocate Encounter  Prior Authorization for Ciclopirox 8% solution has been approved with [employer].    PA# [id number] Effective dates: 02/16/23 through 02/16/24

## 2023-03-30 ENCOUNTER — Other Ambulatory Visit: Payer: Self-pay | Admitting: Nurse Practitioner

## 2023-03-30 DIAGNOSIS — K760 Fatty (change of) liver, not elsewhere classified: Secondary | ICD-10-CM

## 2023-03-30 DIAGNOSIS — K7400 Hepatic fibrosis, unspecified: Secondary | ICD-10-CM

## 2023-04-04 ENCOUNTER — Ambulatory Visit
Admission: RE | Admit: 2023-04-04 | Discharge: 2023-04-04 | Disposition: A | Discharge status: Home / Self Care | Payer: BC Managed Care – PPO | Source: Ambulatory Visit | Attending: Nurse Practitioner | Admitting: Nurse Practitioner

## 2023-04-04 DIAGNOSIS — K760 Fatty (change of) liver, not elsewhere classified: Secondary | ICD-10-CM

## 2023-04-04 DIAGNOSIS — K7400 Hepatic fibrosis, unspecified: Secondary | ICD-10-CM

## 2023-06-25 ENCOUNTER — Telehealth: Payer: Self-pay | Admitting: Family Medicine

## 2023-06-25 NOTE — Telephone Encounter (Signed)
PATIENT IS AWARE THAT DENTIST IS ASKING DR Darlyn Read TO CHANGE HIS BLOOD PRESSURE MEDICATION. ADVISED PATIENT HE NEEDS APPOINTMENT TO MAKE THIS CHANGE AND HE ADVISED THAT THE APPOINTMENT HE HAS NEXT MONTH IS SUFFICIENT. NOT AN URGENT MATTER.

## 2023-07-26 ENCOUNTER — Encounter: Payer: BC Managed Care – PPO | Admitting: Family Medicine

## 2023-08-14 ENCOUNTER — Other Ambulatory Visit: Payer: Self-pay | Admitting: Family Medicine

## 2023-08-14 DIAGNOSIS — N1831 Chronic kidney disease, stage 3a: Secondary | ICD-10-CM

## 2023-08-14 DIAGNOSIS — I10 Essential (primary) hypertension: Secondary | ICD-10-CM

## 2023-08-27 ENCOUNTER — Other Ambulatory Visit: Payer: Self-pay | Admitting: Family Medicine

## 2023-08-27 DIAGNOSIS — I10 Essential (primary) hypertension: Secondary | ICD-10-CM

## 2023-09-07 ENCOUNTER — Encounter: Payer: Self-pay | Admitting: Family Medicine

## 2023-09-07 ENCOUNTER — Ambulatory Visit: Payer: 59 | Admitting: Family Medicine

## 2023-09-07 VITALS — BP 152/66 | HR 75 | Temp 98.0°F | Ht 69.0 in | Wt 223.0 lb

## 2023-09-07 DIAGNOSIS — J40 Bronchitis, not specified as acute or chronic: Secondary | ICD-10-CM

## 2023-09-07 MED ORDER — AMOXICILLIN 500 MG PO CAPS: 500.0000 mg | ORAL_CAPSULE | Freq: Two times a day (BID) | ORAL | 0 refills | Status: DC

## 2023-09-07 NOTE — Progress Notes (Signed)
BP (!) 152/66   Pulse 75   Temp 98 F (36.7 C)   Ht 5\' 9"  (1.753 m)   Wt 223 lb (101.2 kg)   SpO2 97%   BMI 32.93 kg/m    Subjective:   Patient ID: [name] [name], male    DOB: [birth year], 63 y.o.   MRN: [id number]  HPI: [name] [name] is a 63 y.o. male presenting on 09/07/2023 for Cough and Nasal Congestion   HPI Patient is coming in today for cough and congestion and wheezing.  He has been having cough and congestion for about 2 or 3 weeks but just in the past 24 hours he started to have more wheezing and tightness coming down into his chest.  He has been taking some Mucinex and NyQuil and it does not seem to be helping anymore.  He denies any fevers or chills or known sick contacts.  He denies any shortness of breath.  He had mostly the wheezing and coughing at night.  Relevant past medical, surgical, family and social history reviewed and updated as indicated. Interim medical history since our last visit reviewed. Allergies and medications reviewed and updated.  Review of Systems  Constitutional:  Negative for chills and fever.  HENT:  Positive for congestion and postnasal drip. Negative for ear discharge, ear pain, rhinorrhea, sinus pressure, sneezing, sore throat and voice change.   Eyes:  Negative for pain, discharge, redness and visual disturbance.  Respiratory:  Positive for cough and wheezing. Negative for shortness of breath.   Cardiovascular:  Negative for chest pain and leg swelling.  Musculoskeletal:  Negative for gait problem.  Skin:  Negative for rash.  All other systems reviewed and are negative.   Per HPI unless specifically indicated above   Allergies as of 09/07/2023   No Known Allergies      Medication List        Accurate as of September 07, 2023  3:35 PM. If you have any questions, ask your nurse or doctor.          amLODipine 10 MG tablet Commonly known as: NORVASC TAKE 1 TABLET DAILY   amoxicillin 500 MG capsule Commonly known as:  AMOXIL Take 1 capsule (500 mg total) by mouth 2 (two) times daily. Started by: Elige Radon Mykel Mohl   chlorthalidone 25 MG tablet Commonly known as: HYGROTON Take 1 tablet (25 mg total) by mouth daily.   ciclopirox 8 % solution Commonly known as: Penlac Apply topically at bedtime. Apply over nail and surrounding skin. Apply daily over previous coat. After seven (7) days, may remove with alcohol and continue cycle.   Vemlidy 25 MG tablet Generic drug: tenofovir alafenamide Take 25 mg by mouth daily.   Vitamin D (Ergocalciferol) 1.25 MG (50000 UNIT) Caps capsule Commonly known as: DRISDOL Take 1 capsule (50,000 Units total) by mouth every 7 (seven) days.         Objective:   BP (!) 152/66   Pulse 75   Temp 98 F (36.7 C)   Ht 5\' 9"  (1.753 m)   Wt 223 lb (101.2 kg)   SpO2 97%   BMI 32.93 kg/m   Wt Readings from Last 3 Encounters:  09/07/23 223 lb (101.2 kg)  07/24/22 228 lb (103.4 kg)  05/26/22 224 lb 6 oz (101.8 kg)    Physical Exam Vitals and nursing note reviewed.  Constitutional:      General: He is not in acute distress.    Appearance: He is well-developed.  He is not diaphoretic.  HENT:     Right Ear: Tympanic membrane, ear canal and external ear normal.     Left Ear: Tympanic membrane, ear canal and external ear normal.     Nose: Mucosal edema present. No rhinorrhea.     Right Sinus: No maxillary sinus tenderness or frontal sinus tenderness.     Left Sinus: No maxillary sinus tenderness or frontal sinus tenderness.     Mouth/Throat:     Pharynx: Uvula midline. Posterior oropharyngeal erythema present. No oropharyngeal exudate.     Tonsils: No tonsillar abscesses.  Eyes:     General: No scleral icterus.       Right eye: No discharge.     Conjunctiva/sclera: Conjunctivae normal.     Pupils: Pupils are equal, round, and reactive to light.  Neck:     Thyroid: No thyromegaly.  Cardiovascular:     Rate and Rhythm: Normal rate and regular rhythm.     Heart  sounds: Normal heart sounds. No murmur heard. Pulmonary:     Effort: Pulmonary effort is normal. No respiratory distress.     Breath sounds: Normal breath sounds. No wheezing, rhonchi or rales.  Musculoskeletal:        General: Normal range of motion.     Cervical back: Neck supple.  Lymphadenopathy:     Cervical: No cervical adenopathy.  Skin:    General: Skin is warm and dry.     Findings: No rash.  Neurological:     Mental Status: He is alert and oriented to person, place, and time.     Coordination: Coordination normal.  Psychiatric:        Behavior: Behavior normal.       Assessment & Plan:   Problem List Items Addressed This Visit   None Visit Diagnoses       Bronchitis    -  Primary   Relevant Medications   amoxicillin (AMOXIL) 500 MG capsule       Patient has been ill with this for couple weeks and sounds like she is starting to head towards possibly having pneumonia, will treat with amoxicillin he can continue with Mucinex and NyQuil Follow up plan: Return if symptoms worsen or fail to improve.  Counseling provided for all of the vaccine components No orders of the defined types were placed in this encounter.   Arville Care, MD [facility] 09/07/2023, 3:35 PM

## 2023-09-13 ENCOUNTER — Encounter: Payer: Self-pay | Admitting: Family Medicine

## 2023-09-26 ENCOUNTER — Encounter: Payer: Self-pay | Admitting: Family Medicine

## 2023-09-26 ENCOUNTER — Ambulatory Visit (INDEPENDENT_AMBULATORY_CARE_PROVIDER_SITE_OTHER): Payer: 59 | Admitting: Family Medicine

## 2023-09-26 VITALS — BP 124/71 | HR 64 | Temp 97.4°F | Ht 69.0 in | Wt 220.0 lb

## 2023-09-26 DIAGNOSIS — R6882 Decreased libido: Secondary | ICD-10-CM

## 2023-09-26 DIAGNOSIS — Z0001 Encounter for general adult medical examination with abnormal findings: Secondary | ICD-10-CM | POA: Diagnosis not present

## 2023-09-26 DIAGNOSIS — E785 Hyperlipidemia, unspecified: Secondary | ICD-10-CM | POA: Diagnosis not present

## 2023-09-26 DIAGNOSIS — I10 Essential (primary) hypertension: Secondary | ICD-10-CM | POA: Diagnosis not present

## 2023-09-26 DIAGNOSIS — N1831 Chronic kidney disease, stage 3a: Secondary | ICD-10-CM | POA: Diagnosis not present

## 2023-09-26 DIAGNOSIS — Z0000 Encounter for general adult medical examination without abnormal findings: Secondary | ICD-10-CM

## 2023-09-26 DIAGNOSIS — E559 Vitamin D deficiency, unspecified: Secondary | ICD-10-CM

## 2023-09-26 LAB — LIPID PANEL

## 2023-09-26 MED ORDER — CHLORTHALIDONE 25 MG PO TABS: 25.0000 mg | ORAL_TABLET | Freq: Every day | ORAL | 3 refills | Status: DC

## 2023-09-26 MED ORDER — CICLOPIROX 8 % EX SOLN
Freq: Every day | CUTANEOUS | 5 refills | Status: AC
Start: 2023-09-26 — End: ?

## 2023-09-26 MED ORDER — OLMESARTAN MEDOXOMIL-HCTZ 40-25 MG PO TABS: 1.0000 | ORAL_TABLET | Freq: Every day | ORAL | 3 refills | Status: DC

## 2023-09-26 NOTE — Progress Notes (Signed)
Subjective:  Patient ID: [name] [name], male    DOB: [birth year]  Age: 63 y.o. MRN: [id number]  CC: Annual Exam   HPI [name] [name] presents for Annual physical. Dentist asked himto get BP med changed due to gum problem. (Left upper gum ulcer)     09/26/2023   12:00 PM 09/07/2023    3:11 PM 07/24/2022    9:22 AM  Depression screen PHQ 2/9  Decreased Interest 0 0 0  Down, Depressed, Hopeless 0 0 0  PHQ - 2 Score 0 0 0  Altered sleeping 0    Tired, decreased energy 0    Change in appetite 0    Feeling bad or failure about yourself  0    Trouble concentrating 0    Moving slowly or fidgety/restless 0    Suicidal thoughts 0    PHQ-9 Score 0    Difficult doing work/chores Not difficult at all      History [name] [name] has a past medical history of Chronic kidney disease, Hep B w/o coma, chronic, w/o delta (HCC), Hypertension, and Tattoos.   He has a past surgical history that includes Colonoscopy (05/25/2010).   His family history includes Diabetes in his father and mother; Hypertension in his mother.He reports that he has never smoked. He has never used smokeless tobacco. He reports that he does not drink alcohol and does not use drugs.    ROS Review of Systems  Constitutional:  Negative for activity change, fatigue and unexpected weight change.  HENT:  Negative for congestion, ear pain, hearing loss, postnasal drip and trouble swallowing.   Eyes:  Negative for pain and visual disturbance.  Respiratory:  Negative for cough, chest tightness and shortness of breath.   Cardiovascular:  Negative for chest pain, palpitations and leg swelling.  Gastrointestinal:  Negative for abdominal distention, abdominal pain, blood in stool, constipation, diarrhea, nausea and vomiting.  Endocrine: Negative for cold intolerance, heat intolerance and polydipsia.  Genitourinary:  Negative for difficulty urinating, dysuria, flank pain, frequency and urgency.  Musculoskeletal:  Negative for  arthralgias and joint swelling.  Skin:  Negative for color change, rash and wound.  Neurological:  Negative for dizziness, syncope, speech difficulty, weakness, light-headedness, numbness and headaches.  Hematological:  Does not bruise/bleed easily.  Psychiatric/Behavioral:  Negative for confusion, decreased concentration, dysphoric mood and sleep disturbance. The patient is not nervous/anxious.     Objective:  BP 124/71   Pulse 64   Temp (!) 97.4 F (36.3 C)   Ht 5\' 9"  (1.753 m)   Wt 220 lb (99.8 kg)   SpO2 96%   BMI 32.49 kg/m   BP Readings from Last 3 Encounters:  09/26/23 124/71  09/07/23 (!) 152/66  07/24/22 133/70    Wt Readings from Last 3 Encounters:  09/26/23 220 lb (99.8 kg)  09/07/23 223 lb (101.2 kg)  07/24/22 228 lb (103.4 kg)     Physical Exam Constitutional:      Appearance: He is well-developed.  HENT:     Head: Normocephalic and atraumatic.  Eyes:     Pupils: Pupils are equal, round, and reactive to light.  Neck:     Thyroid: No thyromegaly.     Trachea: No tracheal deviation.  Cardiovascular:     Rate and Rhythm: Normal rate and regular rhythm.     Heart sounds: Normal heart sounds. No murmur heard.    No friction rub. No gallop.  Pulmonary:     Breath sounds: Normal breath sounds. No  wheezing or rales.  Abdominal:     General: Bowel sounds are normal. There is no distension.     Palpations: Abdomen is soft. There is no mass.     Tenderness: There is no abdominal tenderness.     Hernia: There is no hernia in the left inguinal area.  Genitourinary:    Penis: Normal.      Testes: Normal.  Musculoskeletal:        General: Normal range of motion.     Cervical back: Normal range of motion.  Lymphadenopathy:     Cervical: No cervical adenopathy.  Skin:    General: Skin is warm and dry.  Neurological:     Mental Status: He is alert and oriented to person, place, and time.       Assessment & Plan:   [name] "Reggie" was seen today for  annual exam.  Diagnoses and all orders for this visit:  Well adult exam  Primary hypertension  Stage 3a chronic kidney disease (HCC)  Dyslipidemia       I am having [name] Rossetti. Steinkamp "Reggie" maintain his Vemlidy, Vitamin D (Ergocalciferol), chlorthalidone, ciclopirox, amLODipine, and amoxicillin.  Allergies as of 09/26/2023   No Known Allergies      Medication List        Accurate as of September 26, 2023 12:16 PM. If you have any questions, ask your nurse or doctor.          amLODipine 10 MG tablet Commonly known as: NORVASC TAKE 1 TABLET DAILY   amoxicillin 500 MG capsule Commonly known as: AMOXIL Take 1 capsule (500 mg total) by mouth 2 (two) times daily.   chlorthalidone 25 MG tablet Commonly known as: HYGROTON Take 1 tablet (25 mg total) by mouth daily.   ciclopirox 8 % solution Commonly known as: Penlac Apply topically at bedtime. Apply over nail and surrounding skin. Apply daily over previous coat. After seven (7) days, may remove with alcohol and continue cycle.   Vemlidy 25 MG tablet Generic drug: tenofovir alafenamide Take 25 mg by mouth daily.   Vitamin D (Ergocalciferol) 1.25 MG (50000 UNIT) Caps capsule Commonly known as: DRISDOL Take 1 capsule (50,000 Units total) by mouth every 7 (seven) days.         Follow-up: No follow-ups on file.  Mechele Claude, M.D.

## 2023-09-28 ENCOUNTER — Encounter: Payer: Self-pay | Admitting: Family Medicine

## 2023-09-28 NOTE — Progress Notes (Signed)
Hello [name],  Your lab result is normal and/or stable.Some minor variations that are not significant are commonly marked abnormal, but do not represent any medical problem for you.  Best regards, Mechele Claude, M.D.

## 2023-09-30 ENCOUNTER — Other Ambulatory Visit: Payer: Self-pay | Admitting: Family Medicine

## 2023-09-30 LAB — CMP14+EGFR
ALT: 28 [IU]/L (ref 0–44)
AST: 31 [IU]/L (ref 0–40)
Albumin: 4.4 g/dL (ref 3.9–4.9)
Alkaline Phosphatase: 60 [IU]/L (ref 44–121)
BUN/Creatinine Ratio: 11 (ref 10–24)
BUN: 17 mg/dL (ref 8–27)
Bilirubin Total: 0.5 mg/dL (ref 0.0–1.2)
CO2: 27 mmol/L (ref 20–29)
Calcium: 9.9 mg/dL (ref 8.6–10.2)
Chloride: 99 mmol/L (ref 96–106)
Creatinine, Ser: 1.57 mg/dL — ABNORMAL HIGH (ref 0.76–1.27)
Globulin, Total: 3.1 g/dL (ref 1.5–4.5)
Glucose: 91 mg/dL (ref 70–99)
Potassium: 3.8 mmol/L (ref 3.5–5.2)
Sodium: 140 mmol/L (ref 134–144)
Total Protein: 7.5 g/dL (ref 6.0–8.5)
eGFR: 49 mL/min/{1.73_m2} — ABNORMAL LOW (ref >59)

## 2023-09-30 LAB — LIPID PANEL
Chol/HDL Ratio: 4.3 {ratio} (ref 0.0–5.0)
Cholesterol, Total: 163 mg/dL (ref 100–199)
HDL: 38 mg/dL — ABNORMAL LOW (ref >39)
LDL Chol Calc (NIH): 109 mg/dL — ABNORMAL HIGH (ref 0–99)
Triglycerides: 86 mg/dL (ref 0–149)
VLDL Cholesterol Cal: 16 mg/dL (ref 5–40)

## 2023-09-30 LAB — CBC WITH DIFFERENTIAL/PLATELET
Basophils Absolute: 0 10*3/uL (ref 0.0–0.2)
Basos: 0 %
EOS (ABSOLUTE): 0.2 10*3/uL (ref 0.0–0.4)
Eos: 2 %
Hematocrit: 41.8 % (ref 37.5–51.0)
Hemoglobin: 14.6 g/dL (ref 13.0–17.7)
Immature Grans (Abs): 0 10*3/uL (ref 0.0–0.1)
Immature Granulocytes: 0 %
Lymphocytes Absolute: 3.1 10*3/uL (ref 0.7–3.1)
Lymphs: 38 %
MCH: 30.5 pg (ref 26.6–33.0)
MCHC: 34.9 g/dL (ref 31.5–35.7)
MCV: 87 fL (ref 79–97)
Monocytes Absolute: 0.6 10*3/uL (ref 0.1–0.9)
Monocytes: 7 %
Neutrophils Absolute: 4.2 10*3/uL (ref 1.4–7.0)
Neutrophils: 53 %
Platelets: 205 10*3/uL (ref 150–450)
RBC: 4.79 x10E6/uL (ref 4.14–5.80)
RDW: 12.7 % (ref 11.6–15.4)
WBC: 8 10*3/uL (ref 3.4–10.8)

## 2023-09-30 LAB — PSA, TOTAL AND FREE
PSA, Free Pct: 49.2 %
PSA, Free: 0.64 ng/mL
Prostate Specific Ag, Serum: 1.3 ng/mL (ref 0.0–4.0)

## 2023-09-30 LAB — TESTOSTERONE,FREE AND TOTAL
Testosterone, Free: 4.7 pg/mL — ABNORMAL LOW (ref 6.6–18.1)
Testosterone: 274 ng/dL (ref 264–916)

## 2023-09-30 LAB — VITAMIN D 25 HYDROXY (VIT D DEFICIENCY, FRACTURES): Vit D, 25-Hydroxy: 34.9 ng/mL (ref 30.0–100.0)

## 2023-09-30 MED ORDER — VITAMIN D (ERGOCALCIFEROL) 1.25 MG (50000 UNIT) PO CAPS: 50000.0000 [IU] | ORAL_CAPSULE | ORAL | 3 refills | Status: AC

## 2023-10-16 ENCOUNTER — Other Ambulatory Visit (HOSPITAL_COMMUNITY): Payer: Self-pay | Admitting: Nurse Practitioner

## 2023-10-16 DIAGNOSIS — B181 Chronic viral hepatitis B without delta-agent: Secondary | ICD-10-CM

## 2023-10-16 DIAGNOSIS — K7400 Hepatic fibrosis, unspecified: Secondary | ICD-10-CM

## 2023-10-24 ENCOUNTER — Ambulatory Visit (HOSPITAL_COMMUNITY)
Admission: RE | Admit: 2023-10-24 | Discharge: 2023-10-24 | Disposition: A | Discharge status: Home / Self Care | Payer: Self-pay | Source: Ambulatory Visit | Attending: Nurse Practitioner | Admitting: Nurse Practitioner

## 2023-10-24 DIAGNOSIS — K7400 Hepatic fibrosis, unspecified: Secondary | ICD-10-CM | POA: Diagnosis present

## 2023-10-24 DIAGNOSIS — B181 Chronic viral hepatitis B without delta-agent: Secondary | ICD-10-CM | POA: Diagnosis present

## 2024-04-24 ENCOUNTER — Other Ambulatory Visit (HOSPITAL_COMMUNITY): Payer: Self-pay | Admitting: Nurse Practitioner

## 2024-04-24 DIAGNOSIS — K760 Fatty (change of) liver, not elsewhere classified: Secondary | ICD-10-CM

## 2024-04-24 DIAGNOSIS — K7400 Hepatic fibrosis, unspecified: Secondary | ICD-10-CM

## 2024-04-24 DIAGNOSIS — B1910 Unspecified viral hepatitis B without hepatic coma: Secondary | ICD-10-CM

## 2024-05-02 ENCOUNTER — Ambulatory Visit (HOSPITAL_COMMUNITY)
Admission: RE | Admit: 2024-05-02 | Discharge: 2024-05-02 | Disposition: A | Discharge status: Home / Self Care | Source: Ambulatory Visit | Attending: Nurse Practitioner | Admitting: Nurse Practitioner

## 2024-05-02 DIAGNOSIS — K7400 Hepatic fibrosis, unspecified: Secondary | ICD-10-CM | POA: Diagnosis present

## 2024-05-02 DIAGNOSIS — K760 Fatty (change of) liver, not elsewhere classified: Secondary | ICD-10-CM | POA: Diagnosis present

## 2024-05-02 DIAGNOSIS — B1910 Unspecified viral hepatitis B without hepatic coma: Secondary | ICD-10-CM | POA: Diagnosis present

## 2024-07-20 ENCOUNTER — Other Ambulatory Visit: Payer: Self-pay | Admitting: Family Medicine

## 2024-07-21 NOTE — Telephone Encounter (Signed)
 [facility] NTBS was to have 6 mos FU in Sept NO RF sent to mail order pharmacy

## 2024-07-22 ENCOUNTER — Encounter: Payer: Self-pay | Admitting: Family Medicine

## 2024-07-22 NOTE — Telephone Encounter (Signed)
 Letter sent

## 2024-08-04 ENCOUNTER — Ambulatory Visit: Admitting: Family Medicine

## 2024-08-04 ENCOUNTER — Encounter: Payer: Self-pay | Admitting: Family Medicine

## 2024-08-04 VITALS — BP 154/67 | HR 52 | Temp 97.8°F | Ht 69.0 in | Wt 221.0 lb

## 2024-08-04 DIAGNOSIS — Z23 Encounter for immunization: Secondary | ICD-10-CM

## 2024-08-04 DIAGNOSIS — B181 Chronic viral hepatitis B without delta-agent: Secondary | ICD-10-CM | POA: Diagnosis not present

## 2024-08-04 DIAGNOSIS — I10 Essential (primary) hypertension: Secondary | ICD-10-CM | POA: Diagnosis not present

## 2024-08-04 MED ORDER — OLMESARTAN MEDOXOMIL-HCTZ 40-25 MG PO TABS: 1.0000 | ORAL_TABLET | Freq: Every day | ORAL | 3 refills | Status: DC

## 2024-08-04 MED ORDER — OLMESARTAN MEDOXOMIL-HCTZ 40-25 MG PO TABS: 1.0000 | ORAL_TABLET | Freq: Every day | ORAL | 3 refills | Status: AC

## 2024-08-04 NOTE — Progress Notes (Signed)
 "  Subjective:  Patient ID: [name] MAGOON, male    DOB: 22-Aug-1960  Age: 63 y.o. MRN: [id number]  CC: Medical Management of Chronic Issues   HPI  Discussed the use of AI scribe software for clinical note transcription with the patient, who gave verbal consent to proceed.  History of Present Illness [name] [name] is a 63 year old male with hypertension who presents for a follow-up visit.  His blood pressure readings at home are typically around 132-135 mmHg. Today, his blood pressure was elevated. He consumed a bowl of cereal and a protein shake, which he thought might have affected his blood pressure.  He is currently taking Tenofovir  300 mg daily, which replaced a previous medication, Vemlidine. He adheres to his medication regimen consistently and attends six-month check-ups, including liver ultrasounds, which have shown no suspicious liver lesions. His last ultrasound was in September 2025.  He experiences shoulder pain, described as a 'two-way' pain, particularly at night. He engages in regular exercise, focusing on lower weight and higher repetitions to maintain muscle definition without increasing bulk.  He has a family history of diabetes and kidney issues, with a sister currently on dialysis. He is vigilant about monitoring his liver and kidney health, given his family history. He takes vitamin D  weekly.  He has been using Ciclopirox  nail treatment, which he started after a recommendation in February. He reports that it is working, although it takes time to see results.          09/26/2023   12:00 PM 09/07/2023    3:11 PM 07/24/2022    9:22 AM  Depression screen PHQ 2/9  Decreased Interest 0 0 0  Down, Depressed, Hopeless 0 0 0  PHQ - 2 Score 0 0 0  Altered sleeping 0    Tired, decreased energy 0    Change in appetite 0    Feeling bad or failure about yourself  0    Trouble concentrating 0    Moving slowly or fidgety/restless 0    Suicidal thoughts 0     PHQ-9 Score 0     Difficult doing work/chores Not difficult at all       Data saved with a previous flowsheet row definition    History [name] [name] has a past medical history of Chronic kidney disease, Hep B w/o coma, chronic, w/o delta (HCC), Hypertension, and Tattoos.   He has a past surgical history that includes Colonoscopy (05/25/2010).   His family history includes Diabetes in his father and mother; Hypertension in his mother.He reports that he has never smoked. He has never used smokeless tobacco. He reports that he does not drink alcohol and does not use drugs.    ROS Review of Systems  Constitutional: Negative.   HENT: Negative.    Eyes:  Negative for visual disturbance.  Respiratory:  Negative for cough and shortness of breath.   Cardiovascular:  Negative for chest pain and leg swelling.  Gastrointestinal:  Negative for abdominal pain, diarrhea, nausea and vomiting.  Genitourinary:  Negative for difficulty urinating.  Musculoskeletal:  Negative for arthralgias and myalgias.  Skin:  Negative for rash.  Neurological:  Negative for headaches.  Psychiatric/Behavioral:  Negative for sleep disturbance.     Objective:  BP (!) 154/67   Pulse (!) 52   Temp 97.8 F (36.6 C)   Ht 5' 9 (1.753 m)   Wt 221 lb (100.2 kg)   SpO2 97%   BMI 32.64 kg/m   BP Readings  from Last 3 Encounters:  08/04/24 (!) 154/67  09/26/23 124/71  09/07/23 (!) 152/66    Wt Readings from Last 3 Encounters:  08/04/24 221 lb (100.2 kg)  09/26/23 220 lb (99.8 kg)  09/07/23 223 lb (101.2 kg)     Physical Exam Physical Exam GENERAL: Alert, cooperative, well developed, no acute distress HEENT: Normocephalic, normal oropharynx, moist mucous membranes CHEST: Clear to auscultation bilaterally, No wheezes, rhonchi, or crackles CARDIOVASCULAR: Normal heart rate and rhythm, S1 and S2 normal without murmurs ABDOMEN: Soft, non-tender, non-distended, without organomegaly, Normal bowel  sounds EXTREMITIES: No cyanosis or edema. 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 For blood pressure  Dispense: 90 tablet; Refill: 3    Assessment and Plan Assessment & Plan Hypertension   Blood pressure is elevated today, though he usually reports readings around 132-135 mmHg. He did not eat properly before the gym, which may have affected today's reading. Kidney function requires close monitoring due to hypertension. Check kidney function today and monitor blood pressure regularly.  Chronic Hepatitis B   He is on Tenofovir  300 mg daily. Regular six-month ultrasounds show no suspicious liver lesions, and liver function is well-managed. Continue Tenofovir  300 mg daily and six-monthly liver ultrasounds.  Biceps Tendinitis   Intermittent shoulder pain, likely due to biceps tendinitis, possibly involving the brachialis muscle, occurs with certain movements and at night. He engages in regular exercise focusing on high repetitions and lower weights. To avoid further injury, adjust the workout routine. Advise lower weight and higher repetitions during workouts. Warm up properly before exercise and apply ice to the shoulder after workouts. Use heat between workouts for pain relief and consider acetaminophen for pain management if needed.  Onychomycosis   He is using Ciclopirox  nail lacquer for onychomycosis. The treatment is effective, though it takes time to show results. It is preferred due to its lack of systemic effects on the liver. Continue  using Ciclopirox  nail lacquer as directed.  General Health Maintenance   He maintains a healthy lifestyle with regular exercise and a balanced diet, influenced by his wife's dietary habits due to her diabetes. He is taking vitamin D  correctly now, as per previous instructions. Check vitamin D  levels during the physical in February and continue regular exercise and a balanced diet.  Follow-up   He has a physical scheduled in February. Blood tests for kidney function and blood sugar are needed today due to elevated blood pressure and family history of diabetes and kidney issues. Perform blood tests for kidney function and blood sugar today and schedule follow-up for the physical in February.       Follow-up: Return in about 6 months (around 02/02/2025).  Butler Der, M.D. "

## 2024-08-04 NOTE — Patient Instructions (Signed)
" °  VISIT SUMMARY: [name] [name], a 63 year old male with hypertension, came in for a follow-up visit. His blood pressure was elevated today, but he usually reports readings around 132-135 mmHg. He is managing chronic Hepatitis B with Tenofovir  and experiences shoulder pain likely due to biceps tendinitis. He is also treating onychomycosis with Ciclopirox  nail lacquer.  YOUR PLAN: HYPERTENSION: Your blood pressure was elevated today, but you usually report readings around 132-135 mmHg. Not eating properly before the gym may have affected today's reading. -Monitor your blood pressure regularly at home. -We will check your kidney function today due to your hypertension.  CHRONIC HEPATITIS B: You are managing your chronic Hepatitis B with Tenofovir  300 mg daily. Your liver ultrasounds have shown no suspicious lesions. -Continue taking Tenofovir  300 mg daily. -Continue with six-monthly liver ultrasounds to monitor your liver health.  BICEPS TENDINITIS: You have intermittent shoulder pain, likely due to biceps tendinitis, which occurs with certain movements and at night. -Adjust your workout routine to focus on lower weights and higher repetitions. -Warm up properly before exercise and apply ice to your shoulder after workouts. -Use heat between workouts for pain relief and consider acetaminophen if needed.  ONYCHOMYCOSIS: You are using Ciclopirox  nail lacquer for onychomycosis, and it is effective though it takes time to show results. -Continue using Ciclopirox  nail lacquer as directed.  GENERAL HEALTH MAINTENANCE: You maintain a healthy lifestyle with regular exercise and a balanced diet. You are taking vitamin D  correctly. -We will check your vitamin D  levels during your physical in February. -Continue your regular exercise and balanced diet.  FOLLOW-UP: You have a physical scheduled in February. Blood tests for kidney function and blood sugar are needed today due to elevated blood pressure and family  history of diabetes and kidney issues. -Perform blood tests for kidney function and blood sugar today. -Schedule a follow-up for your physical in February.                      Contains text generated by [employer].                                 Contains text generated by [employer].   "

## 2024-08-05 ENCOUNTER — Ambulatory Visit: Payer: Self-pay | Admitting: Family Medicine

## 2024-08-05 LAB — BMP8+EGFR
BUN/Creatinine Ratio: 17 (ref 10–24)
BUN: 25 mg/dL (ref 8–27)
CO2: 24 mmol/L (ref 20–29)
Calcium: 10 mg/dL (ref 8.6–10.2)
Chloride: 99 mmol/L (ref 96–106)
Creatinine, Ser: 1.47 mg/dL — ABNORMAL HIGH (ref 0.76–1.27)
Glucose: 89 mg/dL (ref 70–99)
Potassium: 3.8 mmol/L (ref 3.5–5.2)
Sodium: 137 mmol/L (ref 134–144)
eGFR: 53 mL/min/1.73 — ABNORMAL LOW

## 2024-08-05 NOTE — Progress Notes (Signed)
 Hello [name],  Your lab result is normal and/or stable.Some minor variations that are not significant are commonly marked abnormal, but do not represent any medical problem for you.  Best regards, Mechele Claude, M.D.

## 2024-10-02 ENCOUNTER — Encounter: Admitting: Family Medicine
